# Patient Record
Sex: Female | Born: 1955 | Race: White | Hispanic: No | Marital: Married | Smoking: Never smoker
Health system: Southern US, Community
[De-identification: ages and names within clinical notes are randomized; demographics above are authoritative.]

## PROBLEM LIST (undated history)

## (undated) DIAGNOSIS — Z8719 Personal history of other diseases of the digestive system: Secondary | ICD-10-CM

## (undated) DIAGNOSIS — C801 Malignant (primary) neoplasm, unspecified: Secondary | ICD-10-CM

## (undated) DIAGNOSIS — J45909 Unspecified asthma, uncomplicated: Secondary | ICD-10-CM

## (undated) DIAGNOSIS — K219 Gastro-esophageal reflux disease without esophagitis: Secondary | ICD-10-CM

## (undated) HISTORY — PX: SKIN BIOPSY: SHX1

## (undated) HISTORY — PX: ABDOMINAL HYSTERECTOMY: SHX81

---

## 1998-02-02 ENCOUNTER — Other Ambulatory Visit: Admission: RE | Admit: 1998-02-02 | Discharge: 1998-02-02 | Payer: Self-pay | Admitting: Obstetrics and Gynecology

## 1998-02-03 ENCOUNTER — Observation Stay (HOSPITAL_COMMUNITY): Admission: EM | Admit: 1998-02-03 | Discharge: 1998-02-04 | Payer: Self-pay | Admitting: Internal Medicine

## 1998-03-23 ENCOUNTER — Observation Stay (HOSPITAL_COMMUNITY): Admission: RE | Admit: 1998-03-23 | Discharge: 1998-03-24 | Payer: Self-pay | Admitting: Obstetrics and Gynecology

## 1999-03-31 ENCOUNTER — Other Ambulatory Visit: Admission: RE | Admit: 1999-03-31 | Discharge: 1999-03-31 | Payer: Self-pay | Admitting: Obstetrics and Gynecology

## 2000-04-15 ENCOUNTER — Encounter: Payer: Self-pay | Admitting: Family Medicine

## 2000-04-15 ENCOUNTER — Ambulatory Visit (HOSPITAL_COMMUNITY): Admission: RE | Admit: 2000-04-15 | Discharge: 2000-04-15 | Payer: Self-pay | Admitting: Family Medicine

## 2002-01-29 HISTORY — PX: SINUSOTOMY: SHX291

## 2002-03-27 ENCOUNTER — Other Ambulatory Visit: Admission: RE | Admit: 2002-03-27 | Discharge: 2002-03-27 | Payer: Self-pay | Admitting: Obstetrics and Gynecology

## 2003-10-06 ENCOUNTER — Other Ambulatory Visit: Admission: RE | Admit: 2003-10-06 | Discharge: 2003-10-06 | Payer: Self-pay | Admitting: Obstetrics and Gynecology

## 2003-11-15 ENCOUNTER — Ambulatory Visit (HOSPITAL_COMMUNITY): Admission: RE | Admit: 2003-11-15 | Discharge: 2003-11-15 | Payer: Self-pay | Admitting: Family Medicine

## 2003-11-16 ENCOUNTER — Ambulatory Visit (HOSPITAL_COMMUNITY): Admission: RE | Admit: 2003-11-16 | Discharge: 2003-11-16 | Payer: Self-pay | Admitting: Family Medicine

## 2004-12-11 ENCOUNTER — Other Ambulatory Visit: Admission: RE | Admit: 2004-12-11 | Discharge: 2004-12-11 | Payer: Self-pay | Admitting: Obstetrics and Gynecology

## 2005-09-25 ENCOUNTER — Ambulatory Visit: Payer: Self-pay | Admitting: Internal Medicine

## 2005-10-25 ENCOUNTER — Ambulatory Visit: Payer: Self-pay | Admitting: Internal Medicine

## 2006-02-25 ENCOUNTER — Ambulatory Visit: Payer: Self-pay | Admitting: Unknown Physician Specialty

## 2008-04-02 ENCOUNTER — Encounter: Admission: RE | Admit: 2008-04-02 | Discharge: 2008-04-02 | Payer: Self-pay | Admitting: Allergy

## 2010-06-16 NOTE — Assessment & Plan Note (Signed)
[facility]                               PULMONARY OFFICE NOTE   NAME:[name], [name] REBELLO                     MRN:          [id number]  DATE:09/25/2005                            DOB:          02-Jul-1955    PROBLEM:  Pulmonary consultation at the kind request of Dr. Harriett Rush for  this 55 year old never smoker who complains of cough.   HISTORY:  She denies any prior respiratory complaints other than some mild  seasonal rhinitis.  She had gone to [place] about a year ago and came  back with a cold.  The cough from that has lingered.  She cleared  dramatically during each of two rounds of prednisone, but each time the  cough gradually came back again.  She is now using Singulair, Clarinex, and  Advair which she says seem to be of some help, but she still gets a  breakthrough cough.  She is aware of postnasal drainage laying supine but  denies any sense of reflux or difficulty swallowing.  The cough is worse  outdoors.  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  There  has been some mild pressure type earache bilaterally.  A trial of  amoxicillin helped temporarily.  Mild wheeze and a sense of chest congestion  with minimal shortness of breath are noticed more some days than others but  seem to be persistent.  Overall, she feels worse if active outdoors.   REVIEW OF SYSTEMS:  Shortness of breath with exertion, productive and  nonproductive cough.  Symptoms otherwise as described per HPI.  She denies  fever, chills, rash, adenopathy, purulent or bloody discharge, palpitations,  edema, or weight loss.   MEDICATIONS:  Clarinex, Singulair, Advair 250/50.   ALLERGIES:  DRUG INTOLERANCE TO ASPIRIN WITH HIVES.   PAST HISTORY:  Asthma with this illness, otherwise no history of ear, nose,  or throat problems or surgery.  No previous childhood problems with her  ears.  Mild seasonal nasal congestion.  Recognizing her history of urticaria  from aspirin, she has noticed either wheezing with aspirin or any history of  nasal polyps.  There is no history of heart, liver, or kidney disease,  diabetes, or cancer.  Surgery limited to hysterectomy.   SOCIAL HISTORY:  Never smoked.  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  She is married with children of her own, now in  college.   FAMILY HISTORY:  Nobody with respiratory problems except one daughter with  asthma.  Her father had cancer of the colon.   OBJECTIVE:  VITAL SIGNS:  Weight 153 pounds, BP 114/72, pulse rate was 69,  room air saturation 98%.  GENERAL:  A well-developed, well-nourished, apparently comfortable woman.  SKIN:  No rash, adenopathy; none found.  HEENT:  Eyes  and nose are clear.  There is minimal erythema of the throat  without glandular prominence or visible drainage.  Tympanic membranes are  clear.  Voice quality is normal.  CHEST:  Trace cough with deep breath.  No wheeze, rhonchi, or dullness.  Work of breathing is not increased.  HEART:  Heart sounds are regular, normal S1/S2, no murmur or gallop.  ABDOMEN:  No enlargement of liver or spleen.  EXTREMITIES:  No cyanosis, clubbing, or edema.   LABORATORY DATA:  Spirometry from Dr. 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  FTC was 2.84 (79%), FEV-1 2.20  (75%), ratio 77.4 (95% predicted), FEF 25-75% was 61% of predicted.  CBC  from February 19, 2005, was unremarkable with eosinophil percentage 4.5.  Chest x-ray report from August 22, 2005, at [facility], described  normal chest with heavy lung markings.   IMPRESSION:  Cough, by history most  consistent with a postinflammatory  bronchitis.  Common in this situation are low-grade chronic sinus  infections, chronic bacterial inflammation, and cyclical cough with  interacting components of mild reflux, sinus drainage, and bronchospasm.  With luck, she might respond to another aggressive antibiotic burst before  we look for sustaining triggers.   PLAN:  1. Avalox 400 mg daily x 10 days.  2. Pepcid AC, one daily for at least 2-3 weeks.  3. Saline nasal lavage.  4. Schedule to return in three weeks or earlier p.r.n.   I appreciate the chance to meet this nice lady and hope I can be helpful.                                   Clinton D. Maple Hudson, MD, [facility], FACP   CDY/MedQ  DD:  09/26/2005  DT:  09/27/2005  Job #:  [id number]   cc:   Royetta Crochet, MD

## 2010-06-16 NOTE — Assessment & Plan Note (Signed)
 HEALTHCARE                               PULMONARY OFFICE NOTE   NAME:[name] [name], [name] [name]                     MRN:          [id number]  DATE:10/25/2005                            DOB:          [birth year]    PROBLEM:  Cough/bronchitis.   HISTORY:  She says cough completely cleared while she was on the Avalox that  we gave her in August, but within a few days after finishing the antibiotics  the cough gradually returned.  She did not try saline lavage, but she has  been taking the Pepcid.  Avalox gave a little GI upset, which has resolved.   MEDICATIONS:  Clarinex, Singulair, Advair 250/50, Pepcid.   DRUG INTOLERANCES:  ASPIRIN with urticaria.   OBJECTIVE:  Weight 157 pounds, BP 122/80, pulse regular 88, room air  saturation 97%.  She does not look uncomfortable.  Pharynx is a little red.  There is no visible drainage or nasal congestion.  LUNGS:  Sound quite clear and she is not coughing.  Breathing is unlabored.  HEART:  Sounds are regular without murmur.   IMPRESSION:  Cough with the usual suspect causes.  I have favored some  bronchitis but cannot exclude either nasal drainage or a reflux component.  It appears to be responsive to antibiotics, which is the best clue  currently.  Note that chest x-ray showed prominent lung markings as done at  [facility] July 25.   PLAN:  1. Continue daily Pepcid.  2. Omnicef 300 mg b.i.d. for 3 weeks with discussion.  3. Schedule return 1 month, earlier p.r.n.       Clinton D. Maple Hudson, MD, [facility], FACP      CDY/MedQ  DD:  11/04/2005  DT:  11/05/2005  Job #:  [id number]   cc:   Royetta Crochet, MD

## 2011-09-12 ENCOUNTER — Ambulatory Visit
Admission: RE | Admit: 2011-09-12 | Discharge: 2011-09-12 | Disposition: A | Discharge status: Home / Self Care | Payer: BC Managed Care – PPO | Source: Ambulatory Visit | Attending: Family Medicine | Admitting: Family Medicine

## 2011-09-12 ENCOUNTER — Other Ambulatory Visit: Payer: Self-pay | Admitting: Family Medicine

## 2011-09-12 DIAGNOSIS — R1031 Right lower quadrant pain: Secondary | ICD-10-CM

## 2011-09-12 MED ORDER — IOHEXOL 300 MG/ML  SOLN
100.0000 mL | Freq: Once | INTRAMUSCULAR | Status: AC | PRN
  Administered 2011-09-12: 100 mL via INTRAVENOUS

## 2011-09-12 MED ORDER — IOHEXOL 300 MG/ML  SOLN
30.0000 mL | Freq: Once | INTRAMUSCULAR | Status: AC | PRN
  Administered 2011-09-12: 30 mL via ORAL

## 2012-01-10 ENCOUNTER — Ambulatory Visit: Payer: BC Managed Care – PPO | Attending: Family Medicine | Admitting: Physical Therapy

## 2012-01-10 DIAGNOSIS — IMO0001 Reserved for inherently not codable concepts without codable children: Secondary | ICD-10-CM | POA: Insufficient documentation

## 2012-01-10 DIAGNOSIS — M25579 Pain in unspecified ankle and joints of unspecified foot: Secondary | ICD-10-CM | POA: Insufficient documentation

## 2012-01-10 DIAGNOSIS — IMO0002 Reserved for concepts with insufficient information to code with codable children: Secondary | ICD-10-CM | POA: Insufficient documentation

## 2012-01-15 ENCOUNTER — Ambulatory Visit: Payer: BC Managed Care – PPO | Admitting: Physical Therapy

## 2012-01-17 ENCOUNTER — Ambulatory Visit: Payer: BC Managed Care – PPO | Admitting: Physical Therapy

## 2015-03-14 ENCOUNTER — Other Ambulatory Visit: Payer: Self-pay | Admitting: Obstetrics and Gynecology

## 2015-03-14 DIAGNOSIS — R928 Other abnormal and inconclusive findings on diagnostic imaging of breast: Secondary | ICD-10-CM

## 2015-03-18 ENCOUNTER — Ambulatory Visit
Admission: RE | Admit: 2015-03-18 | Discharge: 2015-03-18 | Disposition: A | Discharge status: Home / Self Care | Payer: BC Managed Care – PPO | Source: Ambulatory Visit | Attending: Obstetrics and Gynecology | Admitting: Obstetrics and Gynecology

## 2015-03-18 DIAGNOSIS — R928 Other abnormal and inconclusive findings on diagnostic imaging of breast: Secondary | ICD-10-CM

## 2016-03-15 ENCOUNTER — Other Ambulatory Visit: Payer: Self-pay | Admitting: Obstetrics and Gynecology

## 2016-03-15 DIAGNOSIS — R928 Other abnormal and inconclusive findings on diagnostic imaging of breast: Secondary | ICD-10-CM

## 2016-03-28 ENCOUNTER — Ambulatory Visit
Admission: RE | Admit: 2016-03-28 | Discharge: 2016-03-28 | Disposition: A | Discharge status: Home / Self Care | Payer: BC Managed Care – PPO | Source: Ambulatory Visit | Attending: Obstetrics and Gynecology | Admitting: Obstetrics and Gynecology

## 2016-03-28 ENCOUNTER — Other Ambulatory Visit: Payer: Self-pay | Admitting: Obstetrics and Gynecology

## 2016-03-28 DIAGNOSIS — R599 Enlarged lymph nodes, unspecified: Secondary | ICD-10-CM

## 2016-03-28 DIAGNOSIS — R928 Other abnormal and inconclusive findings on diagnostic imaging of breast: Secondary | ICD-10-CM

## 2016-03-29 ENCOUNTER — Other Ambulatory Visit: Payer: Self-pay | Admitting: Obstetrics and Gynecology

## 2016-03-29 ENCOUNTER — Ambulatory Visit
Admission: RE | Admit: 2016-03-29 | Discharge: 2016-03-29 | Disposition: A | Discharge status: Home / Self Care | Payer: BC Managed Care – PPO | Source: Ambulatory Visit | Attending: Obstetrics and Gynecology | Admitting: Obstetrics and Gynecology

## 2016-03-29 DIAGNOSIS — R599 Enlarged lymph nodes, unspecified: Secondary | ICD-10-CM

## 2016-06-19 ENCOUNTER — Other Ambulatory Visit: Payer: Self-pay | Admitting: Family Medicine

## 2016-06-19 ENCOUNTER — Ambulatory Visit
Admission: RE | Admit: 2016-06-19 | Discharge: 2016-06-19 | Disposition: A | Discharge status: Home / Self Care | Payer: BC Managed Care – PPO | Source: Ambulatory Visit | Attending: Family Medicine | Admitting: Family Medicine

## 2016-06-19 DIAGNOSIS — R5081 Fever presenting with conditions classified elsewhere: Secondary | ICD-10-CM

## 2016-06-22 ENCOUNTER — Ambulatory Visit
Admission: RE | Admit: 2016-06-22 | Discharge: 2016-06-22 | Disposition: A | Discharge status: Home / Self Care | Payer: BC Managed Care – PPO | Source: Ambulatory Visit | Attending: Family Medicine | Admitting: Family Medicine

## 2016-06-22 ENCOUNTER — Other Ambulatory Visit: Payer: Self-pay | Admitting: Family Medicine

## 2016-06-22 DIAGNOSIS — R7989 Other specified abnormal findings of blood chemistry: Secondary | ICD-10-CM

## 2016-06-22 DIAGNOSIS — R945 Abnormal results of liver function studies: Secondary | ICD-10-CM

## 2016-06-22 DIAGNOSIS — R509 Fever, unspecified: Secondary | ICD-10-CM

## 2016-06-22 MED ORDER — IOPAMIDOL (ISOVUE-300) INJECTION 61%
100.0000 mL | Freq: Once | INTRAVENOUS | Status: AC | PRN
  Administered 2016-06-22: 100 mL via INTRAVENOUS

## 2016-07-18 ENCOUNTER — Other Ambulatory Visit: Payer: Self-pay | Admitting: Urology

## 2016-08-14 NOTE — Progress Notes (Signed)
06-19-16 (EPIC) CXR

## 2016-08-14 NOTE — Patient Instructions (Addendum)
[name] [name]  08/14/2016   Your procedure is scheduled on: 08-20-16  Report to [facility] Main  Entrance Take [place]  elevators to 3rd floor to [place] at 5:15 AM.   Call this number if you have problems the morning of surgery [phone]   Remember: ONLY 1 PERSON MAY GO WITH YOU TO SHORT STAY TO GET  READY MORNING OF [place].  Do not eat food or drink liquids :After Midnight.     Take these medicines the morning of surgery with A SIP OF WATER: Rantidine (Zantac)                                You may not have any metal on your body including hair pins and              piercings  Do not wear jewelry, make-up, lotions, powders or perfumes, deodorant             Do not wear nail polish.  Do not shave  48 hours prior to surgery.               Do not bring valuables to the hospital. [place].  Contacts, dentures or bridgework may not be worn into surgery.  Leave suitcase in the car. After surgery it may be brought to your room.                 Please read over the following fact sheets you were given: _____________________________________________________________________             [facility] - Preparing for Surgery Before surgery, you can play an important role.  Because skin is not sterile, your skin needs to be as free of germs as possible.  You can reduce the number of germs on your skin by washing with CHG (chlorahexidine gluconate) soap before surgery.  CHG is an antiseptic cleaner which kills germs and bonds with the skin to continue killing germs even after washing. Please DO NOT use if you have an allergy to CHG or antibacterial soaps.  If your skin becomes reddened/irritated stop using the CHG and inform your nurse when you arrive at Short Stay. Do not shave (including legs and underarms) for at least 48 hours prior to the first CHG shower.  You may shave your face/neck. Please  follow these instructions carefully:  1.  Shower with CHG Soap the night before surgery and the  morning of Surgery.  2.  If you choose to wash your hair, wash your hair first as usual with your  normal  shampoo.  3.  After you shampoo, rinse your hair and body thoroughly to remove the  shampoo.                           4.  Use CHG as you would any other liquid soap.  You can apply chg directly  to the skin and wash                       Gently with a scrungie or clean washcloth.  5.  Apply the CHG Soap to your body ONLY FROM THE NECK  DOWN.   Do not use on face/ open                           Wound or open sores. Avoid contact with eyes, ears mouth and genitals (private parts).                       Wash face,  Genitals (private parts) with your normal soap.             6.  Wash thoroughly, paying special attention to the area where your surgery  will be performed.  7.  Thoroughly rinse your body with warm water from the neck down.  8.  DO NOT shower/wash with your normal soap after using and rinsing off  the CHG Soap.                9.  Pat yourself dry with a clean towel.            10.  Wear clean pajamas.            11.  Place clean sheets on your bed the night of your first shower and do not  sleep with pets. Day of Surgery : Do not apply any lotions/deodorants the morning of surgery.  Please wear clean clothes to the hospital/surgery center.  FAILURE TO FOLLOW THESE INSTRUCTIONS MAY RESULT IN THE CANCELLATION OF YOUR SURGERY PATIENT SIGNATURE_________________________________  NURSE SIGNATURE__________________________________  ________________________________________________________________________   Adam Phenix  An incentive spirometer is a tool that can help keep your lungs clear and active. This tool measures how well you are filling your lungs with each breath. Taking long deep breaths may help reverse or decrease the chance of developing breathing (pulmonary) problems  (especially infection) following:  A long period of time when you are unable to move or be active. BEFORE THE PROCEDURE   If the spirometer includes an indicator to show your best effort, your nurse or respiratory therapist will set it to a desired goal.  If possible, sit up straight or lean slightly forward. Try not to slouch.  Hold the incentive spirometer in an upright position. INSTRUCTIONS FOR USE  1. Sit on the edge of your bed if possible, or sit up as far as you can in bed or on a chair. 2. Hold the incentive spirometer in an upright position. 3. Breathe out normally. 4. Place the mouthpiece in your mouth and seal your lips tightly around it. 5. Breathe in slowly and as deeply as possible, raising the piston or the ball toward the top of the column. 6. Hold your breath for 3-5 seconds or for as long as possible. Allow the piston or ball to fall to the bottom of the column. 7. Remove the mouthpiece from your mouth and breathe out normally. 8. Rest for a few seconds and repeat Steps 1 through 7 at least 10 times every 1-2 hours when you are awake. Take your time and take a few normal breaths between deep breaths. 9. The spirometer may include an indicator to show your best effort. Use the indicator as a goal to work toward during each repetition. 10. After each set of 10 deep breaths, practice coughing to be sure your lungs are clear. If you have an incision (the cut made at the time of surgery), support your incision when coughing by placing a pillow or rolled up towels firmly against it. Once you are able to  get out of bed, walk around indoors and cough well. You may stop using the incentive spirometer when instructed by your caregiver.  RISKS AND COMPLICATIONS  Take your time so you do not get dizzy or light-headed.  If you are in pain, you may need to take or ask for pain medication before doing incentive spirometry. It is harder to take a deep breath if you are having  pain. AFTER USE  Rest and breathe slowly and easily.  It can be helpful to keep track of a log of your progress. Your caregiver can provide you with a simple table to help with this. If you are using the spirometer at home, follow these instructions: [place] IF:   You are having difficultly using the spirometer.  You have trouble using the spirometer as often as instructed.  Your pain medication is not giving enough relief while using the spirometer.  You develop fever of 100.5 F (38.1 C) or higher. SEEK IMMEDIATE MEDICAL CARE IF:   You cough up bloody sputum that had not been present before.  You develop fever of 102 F (38.9 C) or greater.  You develop worsening pain at or near the incision site. MAKE SURE YOU:   Understand these instructions.  Will watch your condition.  Will get help right away if you are not doing well or get worse. Document Released: 05/28/2006 Document Revised: 04/09/2011 Document Reviewed: 07/29/2006 ExitCare Patient Information 2014 ExitCare, [place].   ________________________________________________________________________  WHAT IS A BLOOD TRANSFUSION? Blood Transfusion Information  A transfusion is the replacement of blood or some of its parts. Blood is made up of multiple cells which provide different functions.  Red blood cells carry oxygen and are used for blood loss replacement.  White blood cells fight against infection.  Platelets control bleeding.  Plasma helps clot blood.  Other blood products are available for specialized needs, such as hemophilia or other clotting disorders. BEFORE THE TRANSFUSION  Who gives blood for transfusions?   Healthy volunteers who are fully evaluated to make sure their blood is safe. This is blood bank blood. Transfusion therapy is the safest it has ever been in the practice of medicine. Before blood is taken from a donor, a complete history is taken to make sure that person has no history  of diseases nor engages in risky social behavior (examples are intravenous drug use or sexual activity with multiple partners). The donor's travel history is screened to minimize risk of transmitting infections, such as malaria. The donated blood is tested for signs of infectious diseases, such as HIV and hepatitis. The blood is then tested to be sure it is compatible with you in order to minimize the chance of a transfusion reaction. If you or a relative donates blood, this is often done in anticipation of surgery and is not appropriate for emergency situations. It takes many days to process the donated blood. RISKS AND COMPLICATIONS Although transfusion therapy is very safe and saves many lives, the main dangers of transfusion include:   Getting an infectious disease.  Developing a transfusion reaction. This is an allergic reaction to something in the blood you were given. Every precaution is taken to prevent this. The decision to have a blood transfusion has been considered carefully by your caregiver before blood is given. Blood is not given unless the benefits outweigh the risks. AFTER THE TRANSFUSION  Right after receiving a blood transfusion, you will usually feel much better and more energetic. This is especially true if  your red blood cells have gotten low (anemic). The transfusion raises the level of the red blood cells which carry oxygen, and this usually causes an energy increase.  The nurse administering the transfusion will monitor you carefully for complications. HOME CARE INSTRUCTIONS  No special instructions are needed after a transfusion. You may find your energy is better. Speak with your caregiver about any limitations on activity for underlying diseases you may have. SEEK MEDICAL CARE IF:   Your condition is not improving after your transfusion.  You develop redness or irritation at the intravenous (IV) site. SEEK IMMEDIATE MEDICAL CARE IF:  Any of the following symptoms  occur over the next 12 hours:  Shaking chills.  You have a temperature by mouth above 102 F (38.9 C), not controlled by medicine.  Chest, back, or muscle pain.  People around you feel you are not acting correctly or are confused.  Shortness of breath or difficulty breathing.  Dizziness and fainting.  You get a rash or develop hives.  You have a decrease in urine output.  Your urine turns a dark color or changes to pink, red, or brown. Any of the following symptoms occur over the next 10 days:  You have a temperature by mouth above 102 F (38.9 C), not controlled by medicine.  Shortness of breath.  Weakness after normal activity.  The white part of the eye turns yellow (jaundice).  You have a decrease in the amount of urine or are urinating less often.  Your urine turns a dark color or changes to pink, red, or brown. Document Released: 01/13/2000 Document Revised: 04/09/2011 Document Reviewed: 09/01/2007 [facility] Patient Information 2014 [place], [place].  _______________________________________________________________________

## 2016-08-16 ENCOUNTER — Encounter (HOSPITAL_COMMUNITY): Payer: Self-pay

## 2016-08-16 ENCOUNTER — Encounter (HOSPITAL_COMMUNITY)
Admission: RE | Admit: 2016-08-16 | Discharge: 2016-08-16 | Disposition: A | Discharge status: Home / Self Care | Payer: BC Managed Care – PPO | Source: Ambulatory Visit | Attending: Urology | Admitting: Urology

## 2016-08-16 DIAGNOSIS — D4102 Neoplasm of uncertain behavior of left kidney: Secondary | ICD-10-CM | POA: Insufficient documentation

## 2016-08-16 DIAGNOSIS — Z01818 Encounter for other preprocedural examination: Secondary | ICD-10-CM | POA: Diagnosis present

## 2016-08-16 HISTORY — DX: Personal history of other diseases of the digestive system: Z87.19

## 2016-08-16 HISTORY — DX: Unspecified asthma, uncomplicated: J45.909

## 2016-08-16 HISTORY — DX: Malignant (primary) neoplasm, unspecified: C80.1

## 2016-08-16 HISTORY — DX: Gastro-esophageal reflux disease without esophagitis: K21.9

## 2016-08-16 LAB — CBC
HCT: 41.9 % (ref 36.0–46.0)
Hemoglobin: 14 g/dL (ref 12.0–15.0)
MCH: 29.4 pg (ref 26.0–34.0)
MCHC: 33.4 g/dL (ref 30.0–36.0)
MCV: 88 fL (ref 78.0–100.0)
Platelets: 250 10*3/uL (ref 150–400)
RBC: 4.76 MIL/uL (ref 3.87–5.11)
RDW: 12.9 % (ref 11.5–15.5)
WBC: 6 10*3/uL (ref 4.0–10.5)

## 2016-08-16 LAB — BASIC METABOLIC PANEL
Anion gap: 7 (ref 5–15)
BUN: 10 mg/dL (ref 6–20)
CHLORIDE: 105 mmol/L (ref 101–111)
CO2: 27 mmol/L (ref 22–32)
CREATININE: 0.85 mg/dL (ref 0.44–1.00)
Calcium: 9.3 mg/dL (ref 8.9–10.3)
GFR calc Af Amer: >60 mL/min (ref >60)
GFR calc non Af Amer: >60 mL/min (ref >60)
GLUCOSE: 108 mg/dL — AB (ref 65–99)
Potassium: 4.6 mmol/L (ref 3.5–5.1)
SODIUM: 139 mmol/L (ref 135–145)

## 2016-08-16 LAB — ABO/RH: ABO/RH(D): A POS

## 2016-08-19 NOTE — H&P (Signed)
CC/HPI: CC: Left renal neoplasm    [name] [name] is a 61 year old who underwent a CT scan of the abdomen and pelvis with IV contrast on 06/22/16 for symptoms of fever and elevated LFTs. She was eventually diagnosed with monocleosis but her CT scan incidentally detected a 1.1 cm lateral interpolar hyperdense left renal neoplasm concerning for possible malignancy.     Family history of kidney cancer: None.   Family history of ESRD: No.     Imaging: (06/23/26) - CT of the abdomen and pelvis with contrast   Side of renal neoplasm: Left   Size of renal neoplasm: 1.1 cm   Location of renal neoplasm: Lateral interpolar left kidney   Exophytic or endophytic: Partially exophytic   Renal nephrometry score: 7x     Renal artery anatomy: Single renal artery   Renal vein anatomy: Single renal vein     Contralateral renal lesions:   Regional lymphadenopathy: None.   Adrenal masses: None.   Renal vein/IVC involvement: No.   Metastatic disease to the abdomen: No.     Chest imaging: CXR - normal   LFTs: Normal     Baseline renal function: 0.93 Cr , eGFR >60 ml/min     PMH: Past medical history is significant for hyperlipidemia, asthma, and GERD. She also has a history of melanoma removed from her left upper extremity surgically. She has not had recurrence.   PSH: She has previously undergone a laparoscopic assisted vaginal hysterectomy for benign causes.        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     Tobacco Use Assessment Completed: Used Tobacco in last 30 days?  Has never drank.   Patient's occupation Community education officer.       REVIEW OF SYSTEMS:     GU Review Female:   Patient denies frequent urination, hard to postpone urination, burning /pain with urination, get up at night to urinate, leakage of urine, stream starts and stops, trouble starting your stream, have to strain to urinate, and currently pregnant.   Gastrointestinal (Upper):   Patient denies nausea and vomiting.   Gastrointestinal (Lower):   Patient denies diarrhea and constipation.   Constitutional:   Patient denies fever, night sweats, weight loss, and fatigue.   Skin:   Patient denies skin rash/ lesion and itching.   Eyes:   Patient denies blurred vision and double vision.   Ears/ Nose/ Throat:   Patient denies sore throat and sinus problems.   Hematologic/Lymphatic:   Patient denies swollen glands and easy bruising.   Cardiovascular:   Patient denies leg swelling and chest pains.   Respiratory:  Patient denies cough and shortness of breath.   Endocrine:   Patient denies excessive thirst.   Musculoskeletal:   Patient denies back pain and joint pain.   Neurological:   Patient denies headaches and dizziness.   Psychologic:   Patient denies depression and anxiety.        MULTI-SYSTEM PHYSICAL EXAMINATION:     Constitutional: Well-nourished. No physical deformities. Normally developed. Good grooming.   Neck: Neck symmetrical, not swollen. Normal tracheal position.   Respiratory: No labored breathing, no use of accessory muscles. Clear bilaterally.   Cardiovascular: Normal temperature, normal extremity pulses, no swelling, no varicosities. Regular rate and rhythm.   Lymphatic: No enlargement of neck, axillae, groin.   Skin: No paleness, no jaundice, no cyanosis. No lesion, no ulcer, no  rash.   Neurologic / Psychiatric: Oriented to time, oriented to place, oriented to person. No depression, no anxiety, no agitation.   Gastrointestinal: No mass, no tenderness, no rigidity, non obese abdomen. She does have a well-healed subumbilical laparoscopic incision scar.   Eyes: Normal conjunctivae. Normal eyelids.   Ears, Nose, Mouth, and Throat: Left ear no scars, no lesions, no masses. Right ear no scars, no lesions, no masses. Nose no scars, no lesions, no masses. Normal hearing. Normal lips.   Musculoskeletal: Normal gait and station of head and neck.              ASSESSMENT:    1 GU:   Left renal neoplasm - D49.512      PLAN:         1. Left renal neoplasm suspicious for malignancy:  I have recommended that she undergo a left robot-assisted laparoscopic partial nephrectomy for both diagnostic and therapeutic purposes. We have reviewed the alternative options and the expected recovery process with this procedure. She gives informed consent and would like to proceed. I discussed the potential benefits and risks of the procedure, side effects of the proposed treatment, the likelihood of the patient achieving the goals of the procedure, and any potential problems that might occur during the procedure or recuperation.

## 2016-08-20 ENCOUNTER — Encounter (HOSPITAL_COMMUNITY): Payer: Self-pay | Admitting: *Deleted

## 2016-08-20 ENCOUNTER — Inpatient Hospital Stay (HOSPITAL_COMMUNITY)
Admission: RE | Admit: 2016-08-20 | Discharge: 2016-08-21 | DRG: 658 | Disposition: A | Discharge status: Home / Self Care | Payer: BC Managed Care – PPO | Source: Ambulatory Visit | Attending: Urology | Admitting: Urology

## 2016-08-20 ENCOUNTER — Encounter (HOSPITAL_COMMUNITY)
Admission: RE | Disposition: A | Discharge status: Home / Self Care | Payer: Self-pay | Source: Ambulatory Visit | Attending: Urology

## 2016-08-20 ENCOUNTER — Inpatient Hospital Stay (HOSPITAL_COMMUNITY): Payer: BC Managed Care – PPO | Admitting: Anesthesiology

## 2016-08-20 DIAGNOSIS — Z885 Allergy status to narcotic agent status: Secondary | ICD-10-CM | POA: Diagnosis not present

## 2016-08-20 DIAGNOSIS — D49512 Neoplasm of unspecified behavior of left kidney: Principal | ICD-10-CM | POA: Diagnosis present

## 2016-08-20 DIAGNOSIS — Z85820 Personal history of malignant melanoma of skin: Secondary | ICD-10-CM

## 2016-08-20 DIAGNOSIS — J45909 Unspecified asthma, uncomplicated: Secondary | ICD-10-CM | POA: Diagnosis present

## 2016-08-20 DIAGNOSIS — Z79899 Other long term (current) drug therapy: Secondary | ICD-10-CM

## 2016-08-20 DIAGNOSIS — Z886 Allergy status to analgesic agent status: Secondary | ICD-10-CM

## 2016-08-20 DIAGNOSIS — K219 Gastro-esophageal reflux disease without esophagitis: Secondary | ICD-10-CM | POA: Diagnosis present

## 2016-08-20 DIAGNOSIS — E785 Hyperlipidemia, unspecified: Secondary | ICD-10-CM | POA: Diagnosis present

## 2016-08-20 HISTORY — PX: ROBOT ASSISTED LAPAROSCOPIC NEPHRECTOMY: SHX5140

## 2016-08-20 LAB — BASIC METABOLIC PANEL
Anion gap: 4 — ABNORMAL LOW (ref 5–15)
BUN: 10 mg/dL (ref 6–20)
CO2: 29 mmol/L (ref 22–32)
CREATININE: 0.95 mg/dL (ref 0.44–1.00)
Calcium: 8.6 mg/dL — ABNORMAL LOW (ref 8.9–10.3)
Chloride: 106 mmol/L (ref 101–111)
Glucose, Bld: 152 mg/dL — ABNORMAL HIGH (ref 65–99)
Potassium: 3.7 mmol/L (ref 3.5–5.1)
SODIUM: 139 mmol/L (ref 135–145)

## 2016-08-20 LAB — TYPE AND SCREEN
ABO/RH(D): A POS
Antibody Screen: NEGATIVE

## 2016-08-20 LAB — HEMOGLOBIN AND HEMATOCRIT, BLOOD
HEMATOCRIT: 38 % (ref 36.0–46.0)
HEMOGLOBIN: 12.7 g/dL (ref 12.0–15.0)

## 2016-08-20 SURGERY — ROBOTIC ASSISTED LAPAROSCOPIC NEPHRECTOMY
Anesthesia: General | Site: Abdomen | Laterality: Left

## 2016-08-20 MED ORDER — HYDROMORPHONE HCL-NACL 0.5-0.9 MG/ML-% IV SOSY
PREFILLED_SYRINGE | INTRAVENOUS | Status: AC
  Administered 2016-08-20: 1 mg via INTRAVENOUS
  Filled 2016-08-20: qty 2

## 2016-08-20 MED ORDER — FENTANYL CITRATE (PF) 250 MCG/5ML IJ SOLN
INTRAMUSCULAR | Status: AC
  Filled 2016-08-20: qty 5

## 2016-08-20 MED ORDER — ROCURONIUM BROMIDE 10 MG/ML (PF) SYRINGE
PREFILLED_SYRINGE | INTRAVENOUS | Status: DC | PRN
  Administered 2016-08-20: 20 mg via INTRAVENOUS
  Administered 2016-08-20: 50 mg via INTRAVENOUS
  Administered 2016-08-20: 10 mg via INTRAVENOUS

## 2016-08-20 MED ORDER — FENTANYL CITRATE (PF) 100 MCG/2ML IJ SOLN
INTRAMUSCULAR | Status: DC | PRN
  Administered 2016-08-20 (×3): 50 ug via INTRAVENOUS
  Administered 2016-08-20: 100 ug via INTRAVENOUS
  Administered 2016-08-20 (×3): 50 ug via INTRAVENOUS

## 2016-08-20 MED ORDER — DIPHENHYDRAMINE HCL 12.5 MG/5ML PO ELIX
12.5000 mg | ORAL_SOLUTION | Freq: Four times a day (QID) | ORAL | Status: DC | PRN
Start: 2016-08-20 — End: 2016-08-21

## 2016-08-20 MED ORDER — CEFAZOLIN SODIUM-DEXTROSE 2-4 GM/100ML-% IV SOLN
INTRAVENOUS | Status: AC
  Filled 2016-08-20: qty 100

## 2016-08-20 MED ORDER — MIDAZOLAM HCL 5 MG/5ML IJ SOLN
INTRAMUSCULAR | Status: DC | PRN
  Administered 2016-08-20: 2 mg via INTRAVENOUS

## 2016-08-20 MED ORDER — MIDAZOLAM HCL 2 MG/2ML IJ SOLN
INTRAMUSCULAR | Status: AC
  Filled 2016-08-20: qty 2

## 2016-08-20 MED ORDER — FAMOTIDINE 20 MG PO TABS
10.0000 mg | ORAL_TABLET | Freq: Every day | ORAL | Status: DC
  Administered 2016-08-21: 10 mg via ORAL
  Filled 2016-08-20: qty 1

## 2016-08-20 MED ORDER — SUGAMMADEX SODIUM 200 MG/2ML IV SOLN
INTRAVENOUS | Status: DC | PRN
  Administered 2016-08-20: 150 mg via INTRAVENOUS

## 2016-08-20 MED ORDER — DEXAMETHASONE SODIUM PHOSPHATE 10 MG/ML IJ SOLN
INTRAMUSCULAR | Status: DC | PRN
  Administered 2016-08-20: 10 mg via INTRAVENOUS

## 2016-08-20 MED ORDER — TRAMADOL HCL 50 MG PO TABS: 50.0000 mg | ORAL_TABLET | Freq: Four times a day (QID) | ORAL | 0 refills | Status: DC | PRN

## 2016-08-20 MED ORDER — BUPIVACAINE-EPINEPHRINE (PF) 0.25% -1:200000 IJ SOLN
INTRAMUSCULAR | Status: AC
  Filled 2016-08-20: qty 30

## 2016-08-20 MED ORDER — CEFAZOLIN SODIUM-DEXTROSE 1-4 GM/50ML-% IV SOLN
1.0000 g | Freq: Three times a day (TID) | INTRAVENOUS | Status: AC
  Administered 2016-08-20 (×2): 1 g via INTRAVENOUS
  Filled 2016-08-20 (×2): qty 50

## 2016-08-20 MED ORDER — DOCUSATE SODIUM 100 MG PO CAPS
100.0000 mg | ORAL_CAPSULE | Freq: Two times a day (BID) | ORAL | Status: DC
  Administered 2016-08-20 – 2016-08-21 (×2): 100 mg via ORAL
  Filled 2016-08-20 (×2): qty 1

## 2016-08-20 MED ORDER — MOMETASONE FURO-FORMOTEROL FUM 200-5 MCG/ACT IN AERO
2.0000 | INHALATION_SPRAY | Freq: Two times a day (BID) | RESPIRATORY_TRACT | Status: DC
  Filled 2016-08-20: qty 8.8

## 2016-08-20 MED ORDER — SCOPOLAMINE 1 MG/3DAYS TD PT72
MEDICATED_PATCH | TRANSDERMAL | Status: AC
  Filled 2016-08-20: qty 1

## 2016-08-20 MED ORDER — LEVALBUTEROL TARTRATE 45 MCG/ACT IN AERO: 2.0000 | INHALATION_SPRAY | RESPIRATORY_TRACT | Status: DC | PRN

## 2016-08-20 MED ORDER — BUPIVACAINE LIPOSOME 1.3 % IJ SUSP
20.0000 mL | Freq: Once | INTRAMUSCULAR | Status: AC
  Administered 2016-08-20: 20 mL
  Filled 2016-08-20: qty 20

## 2016-08-20 MED ORDER — HYDROMORPHONE HCL-NACL 0.5-0.9 MG/ML-% IV SOSY
0.2500 mg | PREFILLED_SYRINGE | INTRAVENOUS | Status: DC | PRN
  Administered 2016-08-20 (×2): 0.5 mg via INTRAVENOUS

## 2016-08-20 MED ORDER — DIPHENHYDRAMINE HCL 50 MG/ML IJ SOLN: 12.5000 mg | Freq: Four times a day (QID) | INTRAMUSCULAR | Status: DC | PRN

## 2016-08-20 MED ORDER — SCOPOLAMINE 1 MG/3DAYS TD PT72SCOPOLAMINE 1 MG/3DAYS
MEDICATED_PATCH | TRANSDERMAL | Status: DC | PRN
Start: 2016-08-20 — End: 2016-08-20
  Administered 2016-08-20: 1 via TRANSDERMAL

## 2016-08-20 MED ORDER — STERILE WATER FOR IRRIGATION IR SOLN
Status: DC | PRN
  Administered 2016-08-20: 1000 mL

## 2016-08-20 MED ORDER — ACETAMINOPHEN 10 MG/ML IV SOLN
1000.0000 mg | Freq: Four times a day (QID) | INTRAVENOUS | Status: AC
  Administered 2016-08-20 – 2016-08-21 (×3): 1000 mg via INTRAVENOUS
  Filled 2016-08-20 (×4): qty 100

## 2016-08-20 MED ORDER — DEXTROSE-NACL 5-0.45 % IV SOLN
INTRAVENOUS | Status: DC
  Administered 2016-08-20 – 2016-08-21 (×3): via INTRAVENOUS

## 2016-08-20 MED ORDER — LIDOCAINE 2% (20 MG/ML) 5 ML SYRINGE
INTRAMUSCULAR | Status: DC | PRN
  Administered 2016-08-20: 50 mg via INTRAVENOUS

## 2016-08-20 MED ORDER — ACETAMINOPHEN 10 MG/ML IV SOLN
INTRAVENOUS | Status: AC
  Filled 2016-08-20: qty 100

## 2016-08-20 MED ORDER — SIMVASTATIN 10 MG PO TABS
10.0000 mg | ORAL_TABLET | Freq: Every evening | ORAL | Status: DC
  Administered 2016-08-20: 10 mg via ORAL
  Filled 2016-08-20: qty 1

## 2016-08-20 MED ORDER — SALINE SPRAY 0.65 % NA SOLN: NASAL | Status: DC | PRN

## 2016-08-20 MED ORDER — EPHEDRINE SULFATE-NACL 50-0.9 MG/10ML-% IV SOSY
PREFILLED_SYRINGE | INTRAVENOUS | Status: DC | PRN
  Administered 2016-08-20: 10 mg via INTRAVENOUS

## 2016-08-20 MED ORDER — ALBUTEROL SULFATE (2.5 MG/3ML) 0.083% IN NEBU
2.5000 mg | INHALATION_SOLUTION | Freq: Four times a day (QID) | RESPIRATORY_TRACT | Status: DC | PRN
Start: 2016-08-20 — End: 2016-08-21

## 2016-08-20 MED ORDER — PROMETHAZINE HCL 25 MG/ML IJ SOLN: 6.2500 mg | INTRAMUSCULAR | Status: DC | PRN

## 2016-08-20 MED ORDER — SODIUM CHLORIDE 0.9 % IJ SOLN
INTRAMUSCULAR | Status: AC
  Filled 2016-08-20: qty 20

## 2016-08-20 MED ORDER — TRAMADOL HCL 50 MG PO TABS
50.0000 mg | ORAL_TABLET | Freq: Four times a day (QID) | ORAL | Status: DC | PRN
  Administered 2016-08-21 (×2): 50 mg via ORAL
  Filled 2016-08-20: qty 2
  Filled 2016-08-20: qty 1

## 2016-08-20 MED ORDER — LACTATED RINGERS IV SOLN
INTRAVENOUS | Status: DC | PRN
  Administered 2016-08-20 (×2): via INTRAVENOUS

## 2016-08-20 MED ORDER — ONDANSETRON HCL 4 MG/2ML IJ SOLN
INTRAMUSCULAR | Status: DC | PRN
  Administered 2016-08-20: 4 mg via INTRAVENOUS

## 2016-08-20 MED ORDER — HYDROMORPHONE HCL-NACL 0.5-0.9 MG/ML-% IV SOSY
0.5000 mg | PREFILLED_SYRINGE | INTRAVENOUS | Status: DC | PRN
  Administered 2016-08-20 – 2016-08-21 (×4): 1 mg via INTRAVENOUS
  Filled 2016-08-20 (×4): qty 2

## 2016-08-20 MED ORDER — ONDANSETRON HCL 4 MG/2ML IJ SOLN: 4.0000 mg | INTRAMUSCULAR | Status: DC | PRN

## 2016-08-20 MED ORDER — CEFAZOLIN SODIUM-DEXTROSE 2-4 GM/100ML-% IV SOLN
2.0000 g | INTRAVENOUS | Status: AC
  Administered 2016-08-20: 2 g via INTRAVENOUS

## 2016-08-20 MED ORDER — PROPOFOL 10 MG/ML IV BOLUS
INTRAVENOUS | Status: AC
  Filled 2016-08-20: qty 20

## 2016-08-20 MED ORDER — LACTATED RINGERS IR SOLN
Status: DC | PRN
  Administered 2016-08-20: 1000 mL

## 2016-08-20 MED ORDER — PROPOFOL 10 MG/ML IV BOLUS
INTRAVENOUS | Status: DC | PRN
  Administered 2016-08-20: 150 mg via INTRAVENOUS

## 2016-08-20 SURGICAL SUPPLY — 56 items
ADH SKN CLS APL DERMABOND .7 (GAUZE/BANDAGES/DRESSINGS) ×2
AGENT HMST KT MTR STRL THRMB (HEMOSTASIS) ×1
APL ESCP 34 STRL LF DISP (HEMOSTASIS) ×1
APPLICATOR SURGIFLO ENDO (HEMOSTASIS) ×2 IMPLANT
BAG SPEC RTRVL LRG 6X4 10 (ENDOMECHANICALS) ×1
CHLORAPREP W/TINT 26ML (MISCELLANEOUS) ×3 IMPLANT
CLIP LIGATING HEM O LOK PURPLE (MISCELLANEOUS) ×3 IMPLANT
CLIP LIGATING HEMO O LOK GREEN (MISCELLANEOUS) ×6 IMPLANT
COVER SURGICAL LIGHT HANDLE (MISCELLANEOUS) ×3 IMPLANT
COVER TIP SHEARS 8 DVNC (MISCELLANEOUS) ×1 IMPLANT
COVER TIP SHEARS 8MM DA VINCI (MISCELLANEOUS) ×2
DECANTER SPIKE VIAL GLASS SM (MISCELLANEOUS) ×3 IMPLANT
DERMABOND ADVANCED (GAUZE/BANDAGES/DRESSINGS) ×4
DERMABOND ADVANCED .7 DNX12 (GAUZE/BANDAGES/DRESSINGS) ×2 IMPLANT
DRAIN CHANNEL 15F RND FF 3/16 (WOUND CARE) ×2 IMPLANT
DRAPE ARM DVNC X/XI (DISPOSABLE) ×4 IMPLANT
DRAPE COLUMN DVNC XI (DISPOSABLE) ×1 IMPLANT
DRAPE DA VINCI XI ARM (DISPOSABLE) ×8
DRAPE DA VINCI XI COLUMN (DISPOSABLE) ×2
DRAPE INCISE IOBAN 66X45 STRL (DRAPES) ×3 IMPLANT
DRAPE SHEET LG 3/4 BI-LAMINATE (DRAPES) ×3 IMPLANT
DRSG TEGADERM 4X4.75 (GAUZE/BANDAGES/DRESSINGS) ×2 IMPLANT
ELECT PENCIL ROCKER SW 15FT (MISCELLANEOUS) ×3 IMPLANT
ELECT REM PT RETURN 15FT ADLT (MISCELLANEOUS) ×3 IMPLANT
EVACUATOR SILICONE 100CC (DRAIN) ×3 IMPLANT
GLOVE BIO SURGEON STRL SZ 6.5 (GLOVE) ×2 IMPLANT
GLOVE BIO SURGEONS STRL SZ 6.5 (GLOVE) ×1
GLOVE BIOGEL M STRL SZ7.5 (GLOVE) ×6 IMPLANT
GOWN STRL REUS W/TWL LRG LVL3 (GOWN DISPOSABLE) ×9 IMPLANT
IRRIG SUCT STRYKERFLOW 2 WTIP (MISCELLANEOUS) ×3
IRRIGATION SUCT STRKRFLW 2 WTP (MISCELLANEOUS) IMPLANT
KIT BASIN OR (CUSTOM PROCEDURE TRAY) ×3 IMPLANT
NS IRRIG 1000ML POUR BTL (IV SOLUTION) ×3 IMPLANT
POSITIONER SURGICAL ARM (MISCELLANEOUS) ×6 IMPLANT
POUCH SPECIMEN RETRIEVAL 10MM (ENDOMECHANICALS) ×3 IMPLANT
SEAL CANN UNIV 5-8 DVNC XI (MISCELLANEOUS) ×4 IMPLANT
SEAL XI 5MM-8MM UNIVERSAL (MISCELLANEOUS) ×8
SOLUTION ELECTROLUBE (MISCELLANEOUS) ×3 IMPLANT
SURGIFLO W/THROMBIN 8M KIT (HEMOSTASIS) ×2 IMPLANT
SUT ETHILON 3 0 PS 1 (SUTURE) ×2 IMPLANT
SUT MNCRL AB 4-0 PS2 18 (SUTURE) ×6 IMPLANT
SUT PDS AB 0 CTX 36 PDP370T (SUTURE) IMPLANT
SUT V-LOC BARB 180 2/0GR6 GS22 (SUTURE) ×3
SUT VIC AB 0 CT1 27 (SUTURE) ×3
SUT VIC AB 0 CT1 27XBRD ANTBC (SUTURE) ×1 IMPLANT
SUT VICRYL 0 UR6 27IN ABS (SUTURE) ×3 IMPLANT
SUT VLOC BARB 180 ABS3/0GR12 (SUTURE) ×3
SUTURE V-LC BRB 180 2/0GR6GS22 (SUTURE) ×1 IMPLANT
SUTURE VLOC BRB 180 ABS3/0GR12 (SUTURE) ×1 IMPLANT
TOWEL OR 17X26 10 PK STRL BLUE (TOWEL DISPOSABLE) ×3 IMPLANT
TOWEL OR NON WOVEN STRL DISP B (DISPOSABLE) ×3 IMPLANT
TRAY FOLEY W/METER SILVER 16FR (SET/KITS/TRAYS/PACK) ×3 IMPLANT
TRAY LAPAROSCOPIC (CUSTOM PROCEDURE TRAY) ×3 IMPLANT
TROCAR UNIVERSAL OPT 12M 100M (ENDOMECHANICALS) ×2 IMPLANT
TROCAR XCEL 12X100 BLDLESS (ENDOMECHANICALS) ×3 IMPLANT
WATER STERILE IRR 1000ML POUR (IV SOLUTION) ×6 IMPLANT

## 2016-08-20 NOTE — Anesthesia Postprocedure Evaluation (Signed)
Anesthesia Post Note  Patient: CAPRI VEALS  Procedure(s) Performed: Procedure(s) (LRB): ROBOTIC ASSISTED LAPAROSCOPIC PARTIAL NEPHRECTOMY (Left)     Patient location during evaluation: PACU Anesthesia Type: General Level of consciousness: awake and alert Pain management: pain level controlled Vital Signs Assessment: post-procedure vital signs reviewed and stable Respiratory status: spontaneous breathing, nonlabored ventilation, respiratory function stable and patient connected to nasal cannula oxygen Cardiovascular status: blood pressure returned to baseline and stable Postop Assessment: no signs of nausea or vomiting Anesthetic complications: no    Last Vitals:  Vitals:   08/20/16 1215 08/20/16 1217  BP: 117/68 117/68  Pulse: 75 74  Resp: (!) 9 11  Temp:  36.4 C    Last Pain:  Vitals:   08/20/16 1217  TempSrc:   PainSc: 4                  Deronda Christian S

## 2016-08-20 NOTE — Anesthesia Procedure Notes (Signed)
Procedure Name: Intubation Date/Time: 08/20/2016 7:25 AM Performed by: Anne Fu Pre-anesthesia Checklist: Patient identified, Emergency Drugs available, Suction available, Patient being monitored and Timeout performed Patient Re-evaluated:Patient Re-evaluated prior to induction Oxygen Delivery Method: Circle system utilized Preoxygenation: Pre-oxygenation with 100% oxygen Induction Type: IV induction Ventilation: Mask ventilation without difficulty Laryngoscope Size: Mac and 4 Grade View: Grade I Tube type: Oral Tube size: 7.5 mm Number of attempts: 1 Airway Equipment and Method: Stylet Placement Confirmation: ETT inserted through vocal cords under direct vision,  positive ETCO2 and breath sounds checked- equal and bilateral Secured at: 21 cm Tube secured with: Tape Dental Injury: Teeth and Oropharynx as per pre-operative assessment

## 2016-08-20 NOTE — Op Note (Signed)
Preoperative diagnosis: Left renal neoplasm  Postoperative diagnosis: Left renal neoplasm  Procedure:  1. Left robotic-assisted laparoscopic partial nephrectomy 2. Intraoperative renal ultrasonography 3.   Surgeon: Roxy Horseman, Brooke Bonito. M.D.  Assistant(s): Debbrah Alar, PA-C  An assistant was required for this surgical procedure.  The duties of the assistant included but were not limited to suctioning, passing suture, camera manipulation, retraction. This procedure would not be able to be performed without an Environmental consultant.  Resident: Dr. Jonna Clark  Anesthesia: General  Complications: None  EBL: 150 mL  IVF:  1200 mL crystalloid  Specimens: 1. Left renal neoplasm  Disposition of specimens: Pathology  Intraoperative findings:       1. Warm renal ischemia time: 14 minutes       2. Intraoperative renal ultrasound findings: It was difficult to adequately visualize the renal mass.  The entire kidney was imaged intraoperatively with ultrasound.  The identified gross lesion did correlate with an area that was partially solid and partially cystic and measured 1.3 cm and appeared distinct from the surrounding parenchyma.  No other lesions were noted on the left kidney.  Drains: 1. # 15 Blake perinephric drain  Indication:  [name] [name] is a 61 y.o. year old patient with a left renal neoplasm.  After a thorough review of the management options for their renal mass, they elected to proceed with surgical treatment and the above procedure.  We have discussed the potential benefits and risks of the procedure, side effects of the proposed treatment, the likelihood of the patient achieving the goals of the procedure, and any potential problems that might occur during the procedure or recuperation. Informed consent has been obtained.   Description of procedure:  The patient was taken to the operating room and a general anesthetic was administered. The patient was given  preoperative antibiotics, placed in the left modified flank position with care to pad all potential pressure points, and prepped and draped in the usual sterile fashion. Next a preoperative timeout was performed.  A site was selected in the upper midline for initial port placement. This was placed using a standard open Hassan technique which allowed entry into the peritoneal cavity under direct vision and without difficulty. A 12 mm port was placed and a pneumoperitoneum established. The camera was then used to inspect the abdomen and there was no evidence of any intra-abdominal injuries or other abnormalities. The remaining abdominal ports were then placed. 8 mm robotic ports were placed in the left upper quadrant, left lower quadrant, and far left lateral abdominal wall. A 8 mm port was placed to the left of the midline just off the rectus muscle for the camera.. All ports were placed under direct vision without difficulty. The surgical cart was then docked.   Utilizing the cautery scissors, the white line of Toldt was incised allowing the colon to be mobilized medially and the plane between the mesocolon and the anterior layer of Gerota's fascia to be developed and the kidney to be exposed.  The ureter and gonadal vein were identified inferiorly and the ureter was lifted anteriorly off the psoas muscle.  Dissection proceeded superiorly along the gonadal vein until the renal vein was identified.  The renal hilum was then carefully isolated with a combination of blunt and sharp dissection allowing the renal arterial and venous structures to be separated and isolated in preparation for renal hilar vessel clamping. There was a single renal artery and renal vein.  12.5 g of IV mannitol was then  administered.   Attention turned to the kidney and the perinephric fat surrounding the renal mass was removed and the kidney was mobilized sufficiently for exposure and resection of the renal mass.   Intraoperative  renal ultrasonography was utilized with the laparoscopic drop in ultrasound probe to identify the renal tumor and identify the tumor margins. There was a poorly visualized lesion that did correlate to the gross findings and imaging findings as described above.   Once the renal mass was properly isolated, preparations were made for resection of the tumor.  Reconstructive sutures were placed into the abdomen for the renorrhaphy portion of the procedure.  The renal artery was then clamped with bulldog clamps.  The tumor was then excised with cold scissor dissection along with an adequate visible gross margin of normal renal parenchyma. The tumor appeared to be excised without any gross violation of the tumor. The renal collecting system was not entered during removal of the tumor.  A running 3-0 V-lock suture was then brought through the capsule of the kidney and run along the base of the renal defect to provide hemostasis and close any entry into the renal collecting system if present. Weck clips were used to secure this suture outside the renal capsule at the proximal and distal ends. An additional hemostatic agent (Surgiflo) was then placed into the renal defect. A running 2-0 V lock suture was then used to close the renal capsule using a sliding clip technique which resulted in excellent compression of the renal defect.    The bulldog clamps were then removed from the renal hilar vessel(s) and an additional 12.5 g of IV mannitol was administered. Total warm renal ischemia time was 14 minutes. The renal tumor resection site was examined. Hemostasis appeared adequate.   The kidney was placed back into its normal anatomic position and covered with perinephric fat as needed.  A # 23 Blake drain was then brought through the lateral lower port site and positioned in the perinephric space.  It was secured to the skin with a nylon suture. The surgical cart was undocked.  The renal tumor specimen was removed intact  within an endopouch retrieval bag via the upper midline port site.  All other laparoscopic/robotic ports had been removed under direct vision and the pneumoperitoneum let down with inspection of the operative field performed and hemostasis again confirmed. The upper midline incision was then closed at the fascial level with 0-vicryl suture. All incision sites were then injected with local anesthetic and reapproximated at the skin level with 4-0 monocryl subcuticular closures.  Liquiband was applied to the skin.  The patient tolerated the procedure well and without complications.  The patient was able to be extubated and transferred to the recovery unit in satisfactory condition.  Pryor Curia MD

## 2016-08-20 NOTE — Progress Notes (Addendum)
Patient ID: [name] [name], female   DOB: 10-Jul-1955, 61 y.o.   MRN: [id number] Post-op note  Subjective: The patient is doing well.  No complaints. Denies N/V  Objective: Vital signs in last 24 hours: Temp:  [97.5 F (36.4 C)-97.6 F (36.4 C)] 97.6 F (36.4 C) (07/23 1233) Pulse Rate:  [65-75] 69 (07/23 1233) Resp:  [9-18] 12 (07/23 1233) BP: (116-134)/(62-87) 116/62 (07/23 1233) SpO2:  [96 %-100 %] 96 % (07/23 1233) Weight:  [71.2 kg (157 lb)] 71.2 kg (157 lb) (07/23 0600)  Intake/Output from previous day: No intake/output data recorded. Intake/Output this shift: Total I/O In: 1600 [I.V.:1600] Out: 590 [Urine:400; Drains:40; Blood:150]  Physical Exam:  General: Alert and oriented. Abdomen: Soft, Nondistended. Incisions: Clean and dry. Urine: clear  Lab Results:  Recent Labs  08/20/16 1152  HGB 12.7  HCT 38.0    Assessment/Plan: POD#0   1) Continue to monitor  2) DVT prophy, clears, IS, bed rest,  pain control   LOS: 0 days   DANCY, AMANDA 08/20/2016, 1:19 PM    I have seen and examined the patient and agree with the above assessment and plan.  Will begin ambulation tonight considering small size of tumor and low risk of renal bleeding.  Pt confirms that she has not had a known adverse reaction to tramadol.

## 2016-08-20 NOTE — Anesthesia Preprocedure Evaluation (Signed)
Anesthesia Evaluation  Patient identified by MRN, date of birth, ID band Patient awake    Reviewed: Allergy & Precautions, NPO status , Patient's Chart, lab work & pertinent test results  Airway Mallampati: II  TM Distance: >3 FB Neck ROM: Full    Dental no notable dental hx.    Pulmonary neg pulmonary ROS,    Pulmonary exam normal breath sounds clear to auscultation       Cardiovascular negative cardio ROS Normal cardiovascular exam Rhythm:Regular Rate:Normal     Neuro/Psych negative neurological ROS  negative psych ROS   GI/Hepatic Neg liver ROS, GERD  Medicated,  Endo/Other  negative endocrine ROS  Renal/GU negative Renal ROS  negative genitourinary   Musculoskeletal negative musculoskeletal ROS (+)   Abdominal   Peds negative pediatric ROS (+)  Hematology negative hematology ROS (+)   Anesthesia Other Findings   Reproductive/Obstetrics negative OB ROS                             Anesthesia Physical Anesthesia Plan  ASA: II  Anesthesia Plan: General   Post-op Pain Management:    Induction: Intravenous  PONV Risk Score and Plan: 1 and Ondansetron and Dexamethasone  Airway Management Planned: Oral ETT  Additional Equipment:   Intra-op Plan:   Post-operative Plan: Extubation in OR  Informed Consent: I have reviewed the patients History and Physical, chart, labs and discussed the procedure including the risks, benefits and alternatives for the proposed anesthesia with the patient or authorized representative who has indicated his/her understanding and acceptance.   Dental advisory given  Plan Discussed with: CRNA and Surgeon  Anesthesia Plan Comments:         Anesthesia Quick Evaluation

## 2016-08-20 NOTE — Transfer of Care (Signed)
Immediate Anesthesia Transfer of Care Note  Patient: [name] [name]  Procedure(s) Performed: Procedure(s): ROBOTIC ASSISTED LAPAROSCOPIC PARTIAL NEPHRECTOMY (Left)  Patient Location: PACU  Anesthesia Type:General  Level of Consciousness:  sedated, patient cooperative and responds to stimulation  Airway & Oxygen Therapy:Patient Spontanous Breathing and Patient connected to face mask oxgen  Post-op Assessment:  Report given to PACU RN and Post -op Vital signs reviewed and stable  Post vital signs:  Reviewed and stable  Last Vitals:  Vitals:   08/20/16 0532  BP: 134/73  Pulse: 65  Resp: 18  Temp: 73.4 C    Complications: No apparent anesthesia complications

## 2016-08-20 NOTE — Discharge Instructions (Signed)

## 2016-08-21 ENCOUNTER — Encounter (HOSPITAL_COMMUNITY): Payer: Self-pay | Admitting: Urology

## 2016-08-21 LAB — BASIC METABOLIC PANEL
Anion gap: 9 (ref 5–15)
BUN: 11 mg/dL (ref 6–20)
CHLORIDE: 104 mmol/L (ref 101–111)
CO2: 23 mmol/L (ref 22–32)
CREATININE: 1.06 mg/dL — AB (ref 0.44–1.00)
Calcium: 8.3 mg/dL — ABNORMAL LOW (ref 8.9–10.3)
GFR, EST NON AFRICAN AMERICAN: 56 mL/min — AB (ref >60)
Glucose, Bld: 211 mg/dL — ABNORMAL HIGH (ref 65–99)
POTASSIUM: 3.8 mmol/L (ref 3.5–5.1)
SODIUM: 136 mmol/L (ref 135–145)

## 2016-08-21 LAB — CREATININE, FLUID (PLEURAL, PERITONEAL, JP DRAINAGE): Creat, Fluid: 1 mg/dL

## 2016-08-21 LAB — HEMOGLOBIN AND HEMATOCRIT, BLOOD
HCT: 35.9 % — ABNORMAL LOW (ref 36.0–46.0)
HEMOGLOBIN: 11.7 g/dL — AB (ref 12.0–15.0)

## 2016-08-21 MED ORDER — DOCUSATE SODIUM 100 MG PO CAPS: 100.0000 mg | ORAL_CAPSULE | Freq: Two times a day (BID) | ORAL | 3 refills | Status: AC

## 2016-08-21 MED ORDER — OXYCODONE HCL 5 MG PO TABS: 5.0000 mg | ORAL_TABLET | ORAL | 0 refills | Status: AC | PRN

## 2016-08-21 MED ORDER — OXYCODONE HCL 5 MG PO TABS
5.0000 mg | ORAL_TABLET | ORAL | Status: DC | PRN
  Administered 2016-08-21: 5 mg via ORAL
  Filled 2016-08-21: qty 1

## 2016-08-21 MED ORDER — BISACODYL 10 MG RE SUPP
10.0000 mg | Freq: Once | RECTAL | Status: AC
Start: 2016-08-21 — End: 2016-08-21
  Administered 2016-08-21: 10 mg via RECTAL
  Filled 2016-08-21: qty 1

## 2016-08-21 NOTE — Progress Notes (Signed)
Patient ID: [name] [name], female   DOB: 1955/09/19, 61 y.o.   MRN: [id number]   1 Day Post-Op Subjective: Doing well.   Tolerating clears.  No nausea.  No flatus.  Ambulated once last night.  Objective: Vital signs in last 24 hours: Temp:  [97.5 F (36.4 C)-98.2 F (36.8 C)] 97.9 F (36.6 C) (07/24 0623) Pulse Rate:  [60-75] 70 (07/24 0623) Resp:  [9-17] 12 (07/24 0623) BP: (103-127)/(49-87) 103/49 (07/24 0623) SpO2:  [96 %-100 %] 99 % (07/24 0623)  Intake/Output from previous day: 07/23 0701 - 07/24 0700 In: 4387.5 [P.O.:180; I.V.:3957.5; IV Piggyback:250] Out: 2440 [Urine:2175; Drains:115; Blood:150] Intake/Output this shift: No intake/output data recorded.  Physical Exam:  General: Alert and oriented CV: RRR Lungs: Clear Abdomen: Soft, ND, minimal BS Incisions: C/D/I Ext: NT, No erythema  Lab Results:  Recent Labs  08/20/16 1152 08/21/16 0352  HGB 12.7 11.7*  HCT 38.0 35.9*   BMET  Recent Labs  08/20/16 1152 08/21/16 0352  NA 139 136  K 3.7 3.8  CL 106 104  CO2 29 23  GLUCOSE 152* 211*  BUN 10 11  CREATININE 0.95 1.06*  CALCIUM 8.6* 8.3*     Studies/Results: No results found.  Assessment/Plan: POD # 1 s/p right RAL partial nephrectomy - Ambulate, IS - Advance diet - D/C catheter - Check drain Cr - SL IVF - Oral pain medication - Possible d/c late this afternoon if progresses well throughout today, if not tomorrow AM - Path pending   LOS: 1 day   [name] [name],LES 08/21/2016, 7:20 AM

## 2016-08-21 NOTE — Progress Notes (Signed)
Patient is stable for discharge. Discharge instructions and medications have been reviewed with the patient and all questions answered.   

## 2016-08-21 NOTE — Progress Notes (Signed)
Patient ID: [name] [name], female   DOB: [birth year], 61 y.o.   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  Prognosis is excellent.

## 2016-08-21 NOTE — Discharge Summary (Signed)
[facility] Discharge Summary  Admit date: 08/20/2016  Discharge date and time: 08/21/16   Discharge to: Home  Discharge Service: Urology Discharge Attending Physician:  Dr. Dutch Gray  Discharge  Diagnoses: Left renal mass   Secondary Diagnosis: Active Problems:   Neoplasm of left kidney   OR Procedures: Procedure(s): ROBOTIC ASSISTED LAPAROSCOPIC PARTIAL NEPHRECTOMY 08/20/2016   Ancillary Procedures: None   Discharge Day Services: The patient was seen and examined by the Urology team both in the morning and immediately prior to discharge.  Vital signs and laboratory values were stable and within normal limits.  The physical exam was benign and unchanged and all surgical wounds were examined.  Discharge instructions were explained and all questions answered.  Subjective  No acute events overnight. Pain Controlled. No fever or chills.  Objective Patient Vitals for the past 8 hrs:  BP Temp Temp src Pulse Resp SpO2  08/21/16 1458 139/65 97.8 F (36.6 C) Oral 77 16 95 %   Total I/O In: 462.5 [I.V.:462.5] Out: 1543 [Urine:1525; Drains:18]  General Appearance:        No acute distress Lungs:                 Normal work of breathing on room air Heart:                             Regular rate and rhythm Abdomen:                       Soft, non-tender, non-distended. Incisions clean dry and intact with dermabond. JP site dressed.  Extremities:                  Warm and well perfused    Hospital Course:  The patient underwent left robotic assisted laparoscopic partial nephrectomy on 08/20/2016.  The patient tolerated the procedure well, was extubated in the OR, and afterwards was taken to the PACU for routine post-surgical care. When stable the patient was transferred to the floor.   The patient did well postoperatively.  The patient's diet was slowly advanced and at the time of discharge was tolerating a regular diet.  Foley catheter was removed on POD1 and patient was able  to void spontaneously. JP fluid was checked for urine leak prior to removal, and was negative. JP drain was removed without complication on POD1. The patient was discharged home 1 Day Post-Op, at which point was tolerating a regular solid diet (had not yet passed flatus), was able to void spontaneously, have adequate pain control with P.O. pain medication, and could ambulate without difficulty. The patient will follow up with [place] for post op check.   Condition at Discharge: Improved  Discharge Medications:  Allergies as of 08/21/2016      Reactions   Aspirin Hives, Itching   Codeine Hives, Itching   Hydrocodone-acetaminophen Hives, Itching      Medication List    STOP taking these medications   CALCIUM 600 + D PO   co-enzyme Q-10 30 MG capsule   multivitamin with minerals Tabs tablet   Vitamin D3 2000 units capsule     TAKE these medications   docusate sodium 100 MG capsule Commonly known as:  COLACE Take 1 capsule (100 mg total) by mouth 2 (two) times daily.   Fluticasone-Salmeterol 250-50 MCG/DOSE Aepb Commonly known as:  ADVAIR Inhale 1 puff into the lungs 2 (two) times daily.   levalbuterol  1.25 MG/3ML nebulizer solution Commonly known as:  XOPENEX Inhale 3 mLs into the lungs every 4 (four) hours as needed for shortness of breath or wheezing.   levalbuterol 45 MCG/ACT inhaler Commonly known as:  XOPENEX HFA Inhale 2 puffs into the lungs every 4 (four) hours as needed for shortness of breath or wheezing.   MINIVELLE 0.1 MG/24HR patch Generic drug:  estradiol Place 0.1 mg onto the skin 2 (two) times a week.   NASAL SPRAY NA Place 1 spray into both nostrils every 4 (four) hours as needed (congestion).   oxyCODONE 5 MG immediate release tablet Commonly known as:  Oxy IR/ROXICODONE Take 1 tablet (5 mg total) by mouth every 4 (four) hours as needed for moderate pain or severe pain.   ranitidine 75 MG tablet Commonly known as:  ZANTAC Take 75 mg by mouth daily.    simvastatin 10 MG tablet Commonly known as:  ZOCOR Take 10 mg by mouth every evening.        Pending Test Results: surgical pathology   Discharge Instructions: . Discharge Instructions    Call MD for:  persistant nausea and vomiting    Complete by:  As directed    Call MD for:  redness, tenderness, or signs of infection (pain, swelling, redness, odor or green/yellow discharge around incision site)    Complete by:  As directed    Call MD for:  severe uncontrolled pain    Complete by:  As directed    Call MD for:  temperature >100.4    Complete by:  As directed    Diet general    Complete by:  As directed    Discharge instructions    Complete by:  As directed    Activity:  You are encouraged to ambulate frequently (about every hour during waking hours) to help prevent blood clots from forming in your legs or lungs.  However, you should not engage in any heavy lifting (> 10-15 lbs), strenuous activity, or straining. Diet: You should advance your diet as instructed by your physician.  It will be normal to have some bloating, nausea, and abdominal discomfort intermittently. Prescriptions:  You will be provided a prescription for pain medication to take as needed.  If your pain is not severe enough to require the prescription pain medication, you may take extra strength Tylenol instead which will have less side effects.  You should also take a prescribed stool softener to avoid straining with bowel movements as the prescription pain medication may constipate you. Incisions: You may remove your dressing bandages 48 hours after surgery if not removed in the hospital.  You will either have some small staples or special tissue glue at each of the incision sites. Once the bandages are removed (if present), the incisions may stay open to air.  You may start showering (but not soaking or bathing in water) the 2nd day after surgery and the incisions simply need to be patted dry after the shower.  No  additional care is needed. What to call us about: You should call the office [phone]) if you develop fever > 101 or develop persistent vomiting.   Driving Restrictions    Complete by:  As directed    No driving while taking narcotic medication   Increase activity slowly    Complete by:  As directed    Remove dressing in 48 hours    Complete by:  As directed    Remove drain dressing in 48 hours. Change as needed  when soiled. Hole will close on its own in the next 3-4 days

## 2017-05-01 ENCOUNTER — Ambulatory Visit (HOSPITAL_COMMUNITY)
Admission: RE | Admit: 2017-05-01 | Discharge: 2017-05-01 | Disposition: A | Discharge status: Home / Self Care | Payer: BC Managed Care – PPO | Source: Ambulatory Visit | Attending: Urology | Admitting: Urology

## 2017-05-01 ENCOUNTER — Other Ambulatory Visit (HOSPITAL_COMMUNITY): Payer: Self-pay | Admitting: Urology

## 2017-05-01 DIAGNOSIS — C642 Malignant neoplasm of left kidney, except renal pelvis: Secondary | ICD-10-CM

## 2017-12-04 ENCOUNTER — Ambulatory Visit (HOSPITAL_COMMUNITY)
Admission: RE | Admit: 2017-12-04 | Discharge: 2017-12-04 | Disposition: A | Discharge status: Home / Self Care | Payer: BC Managed Care – PPO | Source: Ambulatory Visit | Attending: Urology | Admitting: Urology

## 2017-12-04 ENCOUNTER — Other Ambulatory Visit: Payer: Self-pay | Admitting: Urology

## 2017-12-04 DIAGNOSIS — C642 Malignant neoplasm of left kidney, except renal pelvis: Secondary | ICD-10-CM

## 2018-06-20 ENCOUNTER — Other Ambulatory Visit: Payer: Self-pay

## 2018-06-20 ENCOUNTER — Ambulatory Visit (HOSPITAL_COMMUNITY)
Admission: RE | Admit: 2018-06-20 | Discharge: 2018-06-20 | Disposition: A | Discharge status: Home / Self Care | Payer: BC Managed Care – PPO | Source: Ambulatory Visit | Attending: Urology | Admitting: Urology

## 2018-06-20 ENCOUNTER — Other Ambulatory Visit (HOSPITAL_COMMUNITY): Payer: Self-pay | Admitting: Urology

## 2018-06-20 DIAGNOSIS — C642 Malignant neoplasm of left kidney, except renal pelvis: Secondary | ICD-10-CM | POA: Diagnosis not present

## 2020-11-04 IMAGING — CR CHEST - 2 VIEW
2 series · 2 of 2 positions shown · non-contrast
Comparison: 12/04/2017

CLINICAL DATA: History of renal cell carcinoma

EXAM:
CHEST - 2 VIEW

[w chest pa]
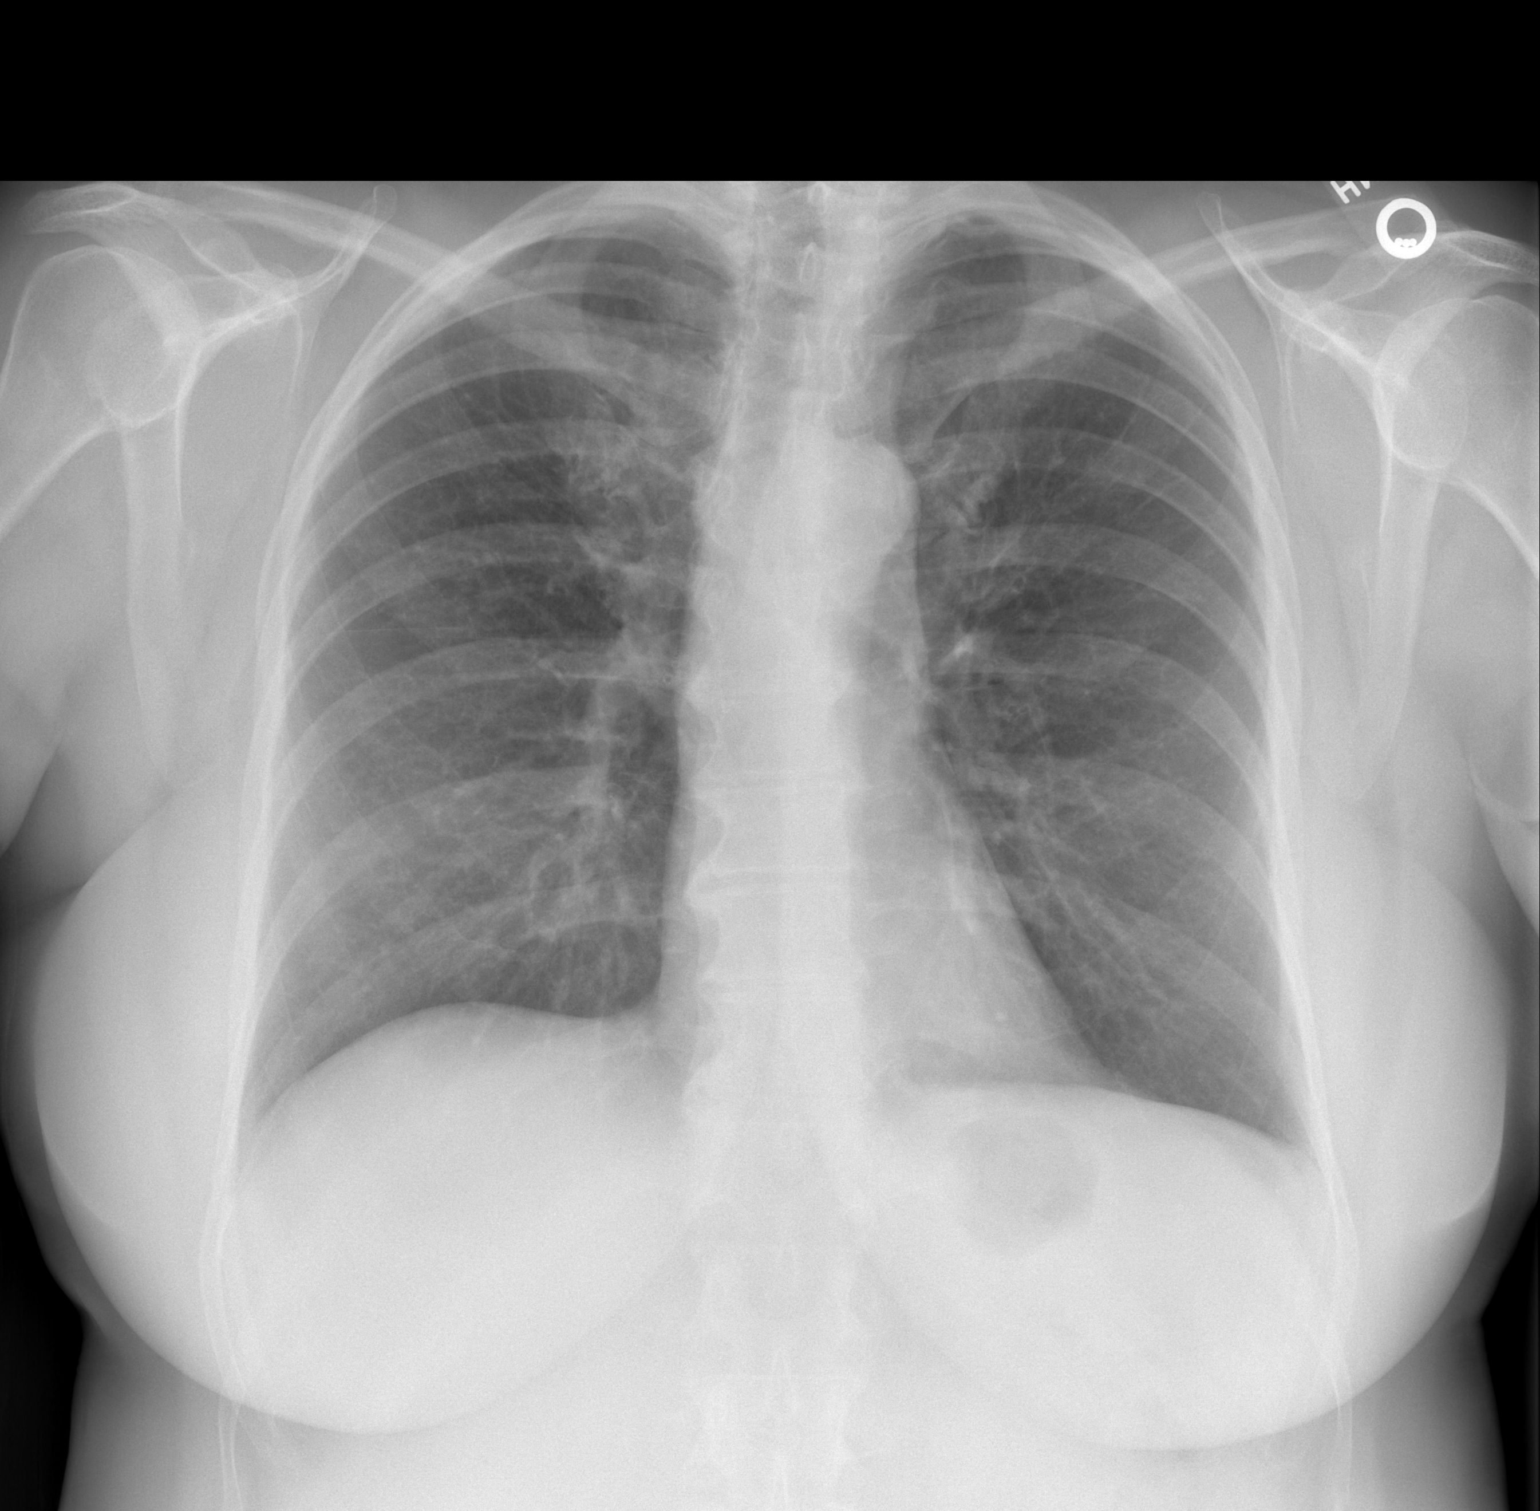

[w chest lat]
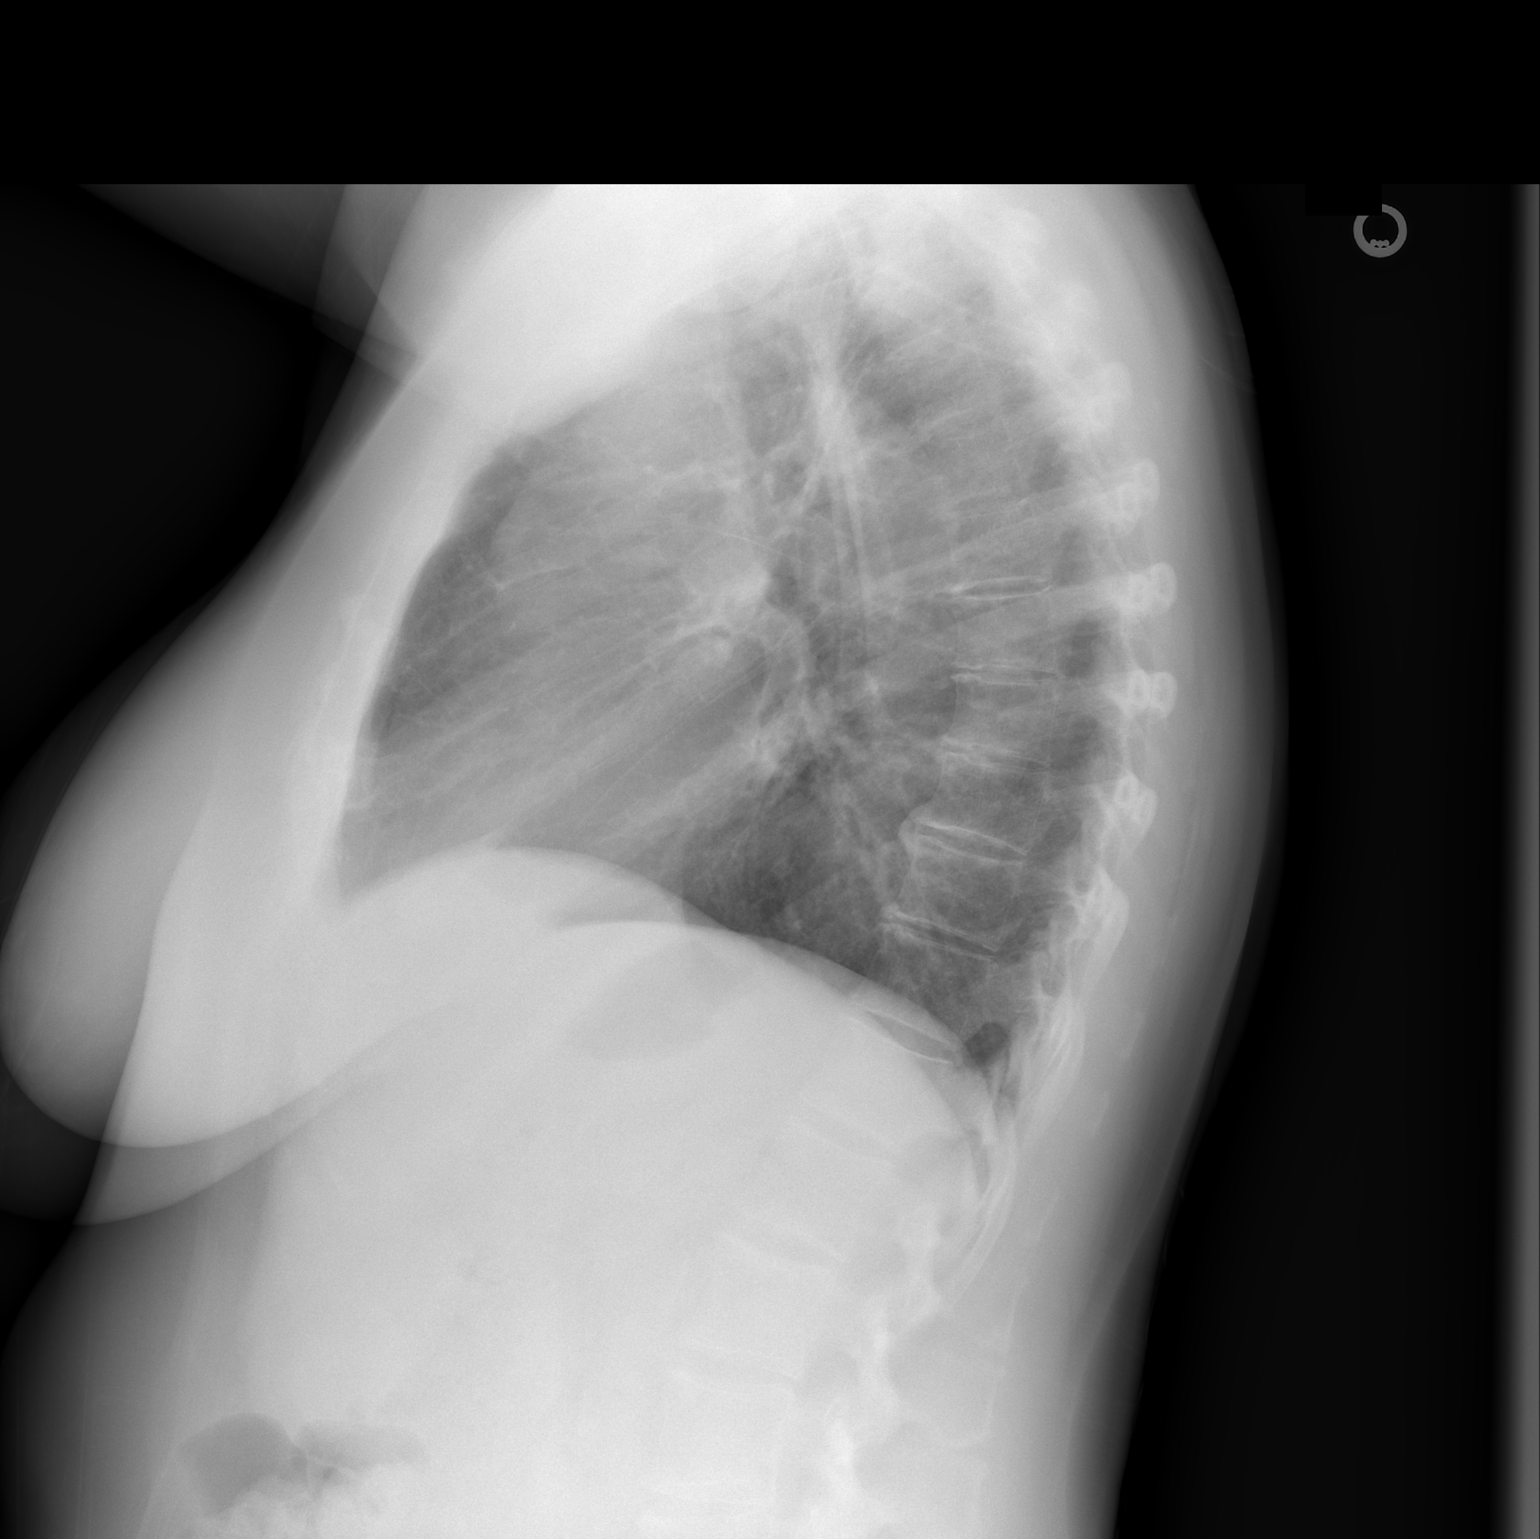

[2 of 2 positions shown; findings below may reference images not displayed]

FINDINGS: The heart size and mediastinal contours are within normal limits.
Both lungs are clear. The visualized skeletal structures are
unremarkable.
IMPRESSION: No acute abnormality of the lungs. No radiographic evidence of
pulmonary metastatic disease. Please note that CT is more sensitive
for the detection of pulmonary metastatic disease.

## 2020-12-07 ENCOUNTER — Other Ambulatory Visit (HOSPITAL_COMMUNITY): Payer: Self-pay | Admitting: Urology

## 2020-12-07 ENCOUNTER — Ambulatory Visit (HOSPITAL_COMMUNITY)
Admission: RE | Admit: 2020-12-07 | Discharge: 2020-12-07 | Disposition: A | Discharge status: Home / Self Care | Payer: BC Managed Care – PPO | Source: Ambulatory Visit | Attending: Urology | Admitting: Urology

## 2020-12-07 DIAGNOSIS — Z85528 Personal history of other malignant neoplasm of kidney: Secondary | ICD-10-CM

## 2021-12-06 ENCOUNTER — Ambulatory Visit (HOSPITAL_COMMUNITY)
Admission: RE | Admit: 2021-12-06 | Discharge: 2021-12-06 | Disposition: A | Discharge status: Home / Self Care | Payer: BC Managed Care – PPO | Source: Ambulatory Visit | Attending: Urology | Admitting: Urology

## 2021-12-06 ENCOUNTER — Other Ambulatory Visit (HOSPITAL_COMMUNITY): Payer: Self-pay | Admitting: Urology

## 2021-12-06 DIAGNOSIS — Z85528 Personal history of other malignant neoplasm of kidney: Secondary | ICD-10-CM | POA: Insufficient documentation

## 2022-09-10 DIAGNOSIS — E119 Type 2 diabetes mellitus without complications: Secondary | ICD-10-CM | POA: Diagnosis not present

## 2022-09-10 DIAGNOSIS — Z85528 Personal history of other malignant neoplasm of kidney: Secondary | ICD-10-CM | POA: Diagnosis not present

## 2022-09-10 DIAGNOSIS — R748 Abnormal levels of other serum enzymes: Secondary | ICD-10-CM | POA: Diagnosis not present

## 2022-09-10 DIAGNOSIS — J4520 Mild intermittent asthma, uncomplicated: Secondary | ICD-10-CM | POA: Diagnosis not present

## 2022-09-10 DIAGNOSIS — E559 Vitamin D deficiency, unspecified: Secondary | ICD-10-CM | POA: Diagnosis not present

## 2022-09-10 DIAGNOSIS — J309 Allergic rhinitis, unspecified: Secondary | ICD-10-CM | POA: Diagnosis not present

## 2022-09-10 DIAGNOSIS — E7800 Pure hypercholesterolemia, unspecified: Secondary | ICD-10-CM | POA: Diagnosis not present

## 2022-09-10 DIAGNOSIS — Z85820 Personal history of malignant melanoma of skin: Secondary | ICD-10-CM | POA: Diagnosis not present

## 2023-03-20 DIAGNOSIS — K760 Fatty (change of) liver, not elsewhere classified: Secondary | ICD-10-CM | POA: Diagnosis not present

## 2023-03-20 DIAGNOSIS — E7800 Pure hypercholesterolemia, unspecified: Secondary | ICD-10-CM | POA: Diagnosis not present

## 2023-03-20 DIAGNOSIS — Z85820 Personal history of malignant melanoma of skin: Secondary | ICD-10-CM | POA: Diagnosis not present

## 2023-03-20 DIAGNOSIS — K219 Gastro-esophageal reflux disease without esophagitis: Secondary | ICD-10-CM | POA: Diagnosis not present

## 2023-03-20 DIAGNOSIS — J4520 Mild intermittent asthma, uncomplicated: Secondary | ICD-10-CM | POA: Diagnosis not present

## 2023-03-20 DIAGNOSIS — Z85528 Personal history of other malignant neoplasm of kidney: Secondary | ICD-10-CM | POA: Diagnosis not present

## 2023-03-20 DIAGNOSIS — E119 Type 2 diabetes mellitus without complications: Secondary | ICD-10-CM | POA: Diagnosis not present

## 2023-04-03 DIAGNOSIS — H02834 Dermatochalasis of left upper eyelid: Secondary | ICD-10-CM | POA: Diagnosis not present

## 2023-04-03 DIAGNOSIS — H5055 Alternating heterophoria: Secondary | ICD-10-CM | POA: Diagnosis not present

## 2023-04-03 DIAGNOSIS — H25813 Combined forms of age-related cataract, bilateral: Secondary | ICD-10-CM | POA: Diagnosis not present

## 2023-04-03 DIAGNOSIS — E119 Type 2 diabetes mellitus without complications: Secondary | ICD-10-CM | POA: Diagnosis not present

## 2023-04-03 DIAGNOSIS — H02831 Dermatochalasis of right upper eyelid: Secondary | ICD-10-CM | POA: Diagnosis not present

## 2023-04-03 DIAGNOSIS — H40013 Open angle with borderline findings, low risk, bilateral: Secondary | ICD-10-CM | POA: Diagnosis not present

## 2023-04-09 DIAGNOSIS — E1136 Type 2 diabetes mellitus with diabetic cataract: Secondary | ICD-10-CM | POA: Diagnosis not present

## 2023-04-09 DIAGNOSIS — J45909 Unspecified asthma, uncomplicated: Secondary | ICD-10-CM | POA: Diagnosis not present

## 2023-04-09 DIAGNOSIS — H25811 Combined forms of age-related cataract, right eye: Secondary | ICD-10-CM | POA: Diagnosis not present

## 2023-04-09 DIAGNOSIS — F419 Anxiety disorder, unspecified: Secondary | ICD-10-CM | POA: Diagnosis not present

## 2023-04-09 DIAGNOSIS — Z79899 Other long term (current) drug therapy: Secondary | ICD-10-CM | POA: Diagnosis not present

## 2023-04-09 DIAGNOSIS — K219 Gastro-esophageal reflux disease without esophagitis: Secondary | ICD-10-CM | POA: Diagnosis not present

## 2023-04-10 DIAGNOSIS — E119 Type 2 diabetes mellitus without complications: Secondary | ICD-10-CM | POA: Diagnosis not present

## 2023-04-10 DIAGNOSIS — E7800 Pure hypercholesterolemia, unspecified: Secondary | ICD-10-CM | POA: Diagnosis not present

## 2023-04-16 DIAGNOSIS — H25812 Combined forms of age-related cataract, left eye: Secondary | ICD-10-CM | POA: Diagnosis not present

## 2023-04-16 DIAGNOSIS — E119 Type 2 diabetes mellitus without complications: Secondary | ICD-10-CM | POA: Diagnosis not present

## 2023-04-16 DIAGNOSIS — J45909 Unspecified asthma, uncomplicated: Secondary | ICD-10-CM | POA: Diagnosis not present

## 2023-04-16 DIAGNOSIS — E7800 Pure hypercholesterolemia, unspecified: Secondary | ICD-10-CM | POA: Diagnosis not present

## 2023-04-24 IMAGING — DX DG CHEST 2V
2 series · 2 of 2 positions shown · non-contrast
Comparison: CT chest-abdomen 12/16/2019; X-ray chest 06/20/2018.

CLINICAL DATA: Renal cancer.

EXAM:
CHEST - 2 VIEW

[chest pa]
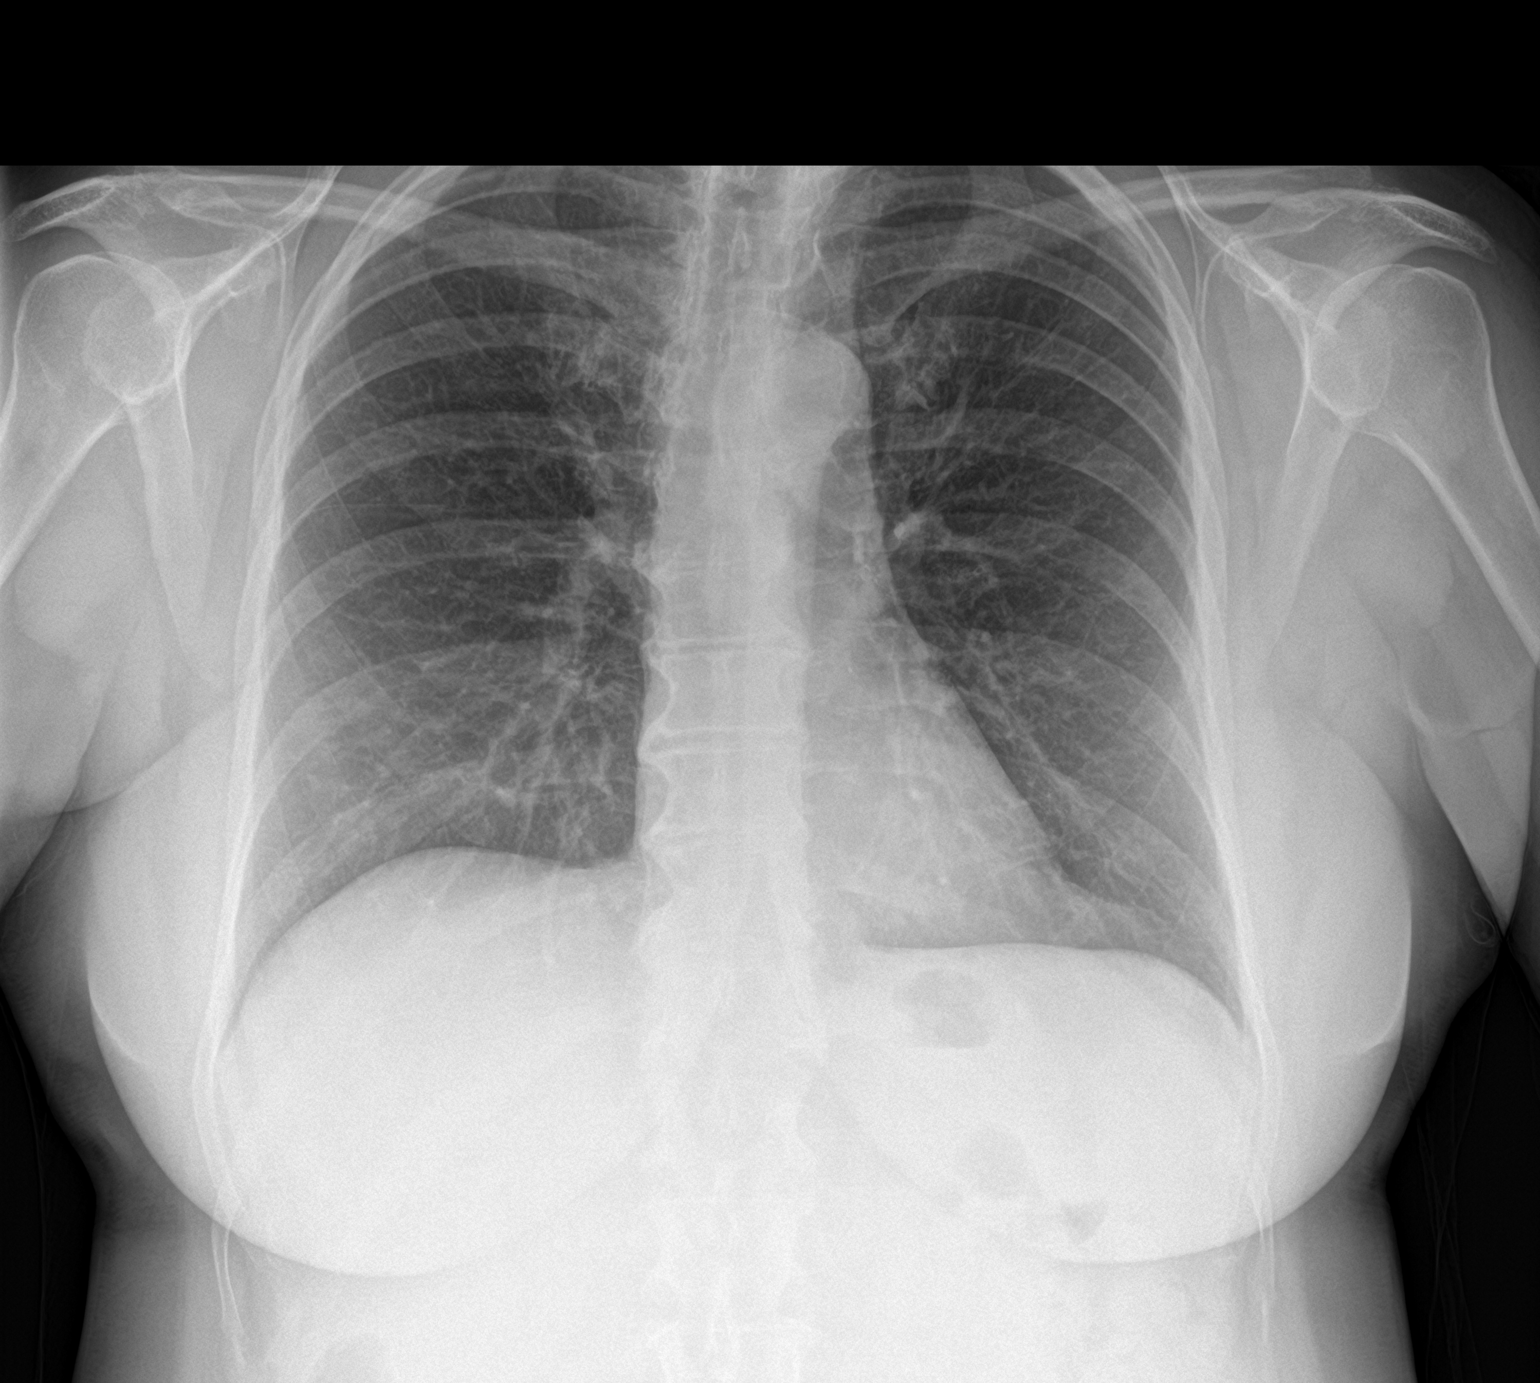

[chest lat]
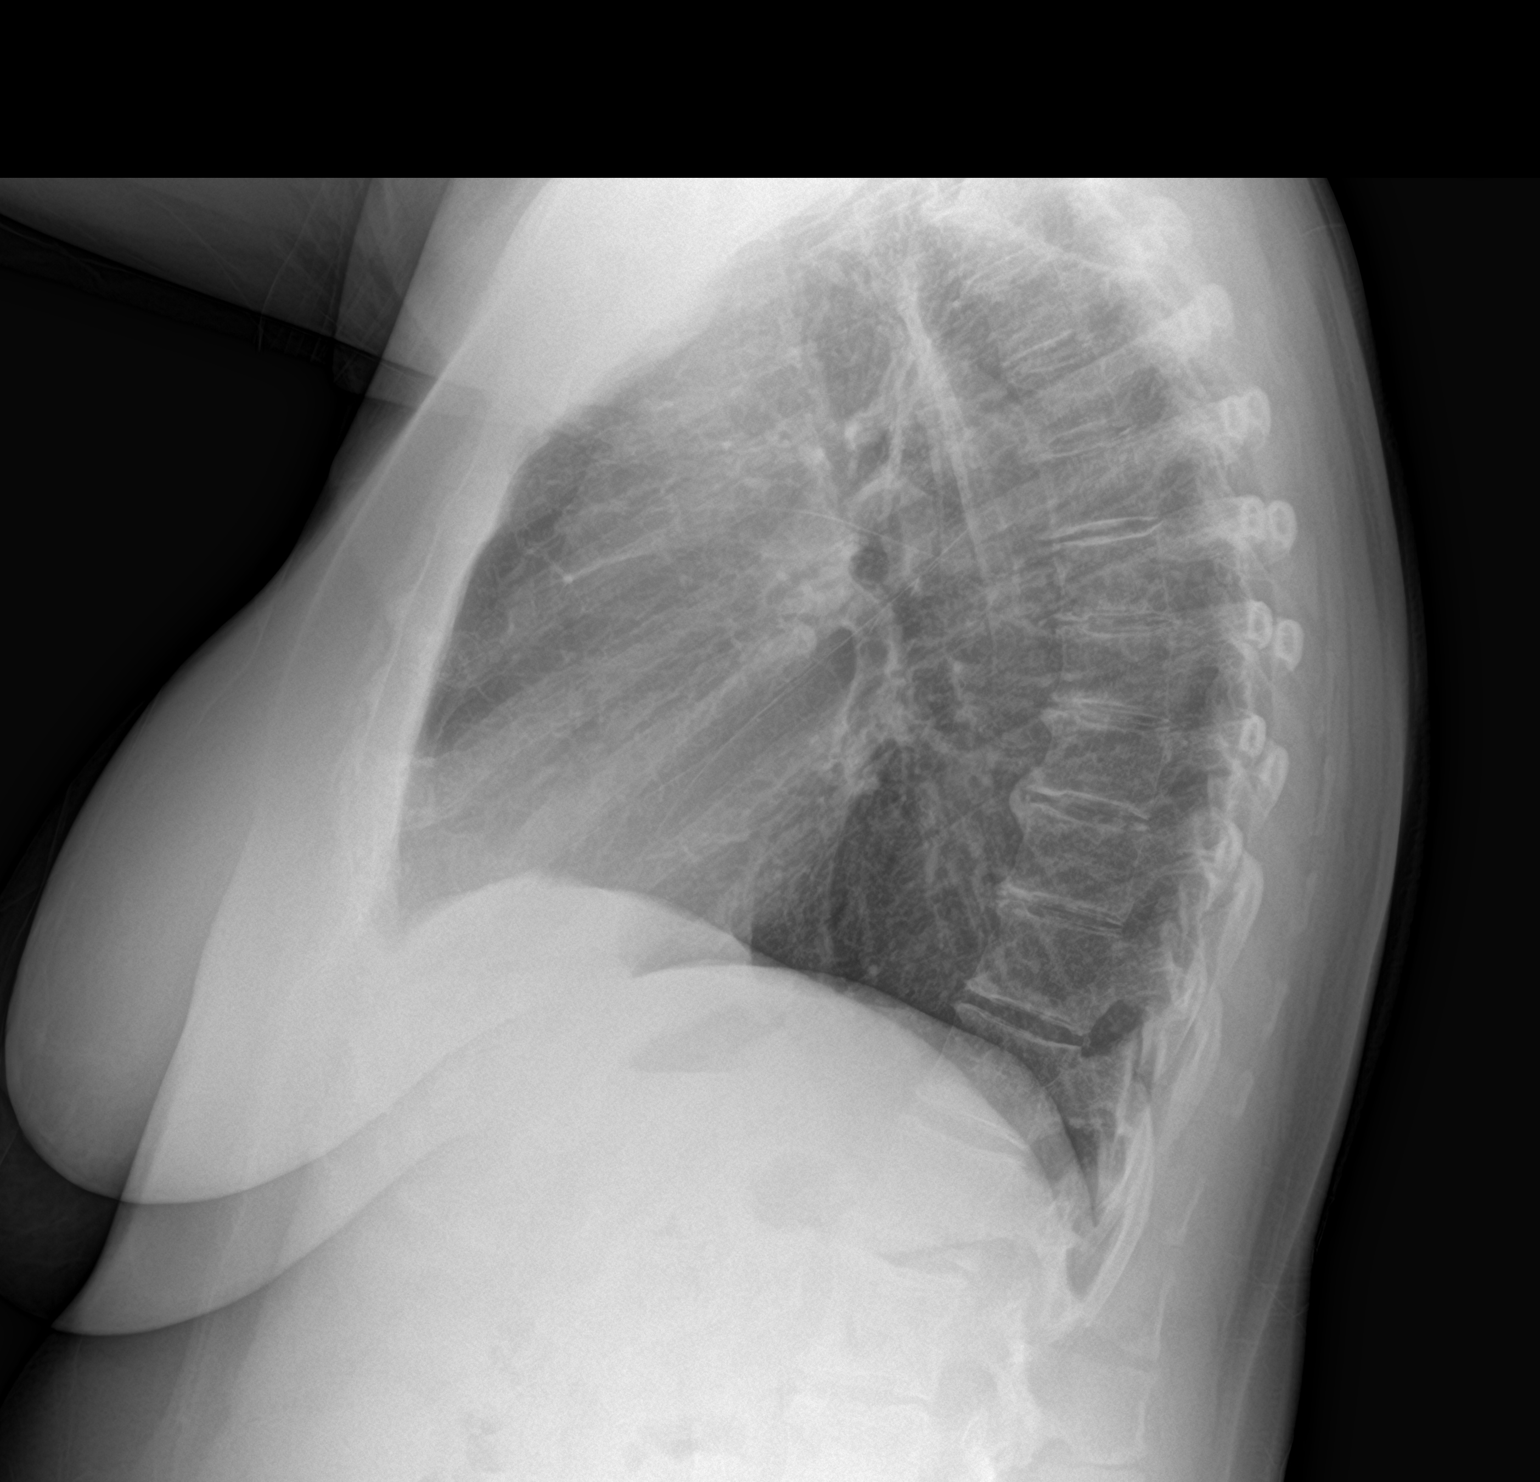

[2 of 2 positions shown; findings below may reference images not displayed]

FINDINGS: The heart size and mediastinal contours are within normal limits. No
focal airspace consolidation, pleural effusion, or pneumothorax. The
visualized skeletal structures are unremarkable.
IMPRESSION: No active cardiopulmonary disease.

## 2023-05-30 DIAGNOSIS — Z6824 Body mass index (BMI) 24.0-24.9, adult: Secondary | ICD-10-CM | POA: Diagnosis not present

## 2023-05-30 DIAGNOSIS — Z01419 Encounter for gynecological examination (general) (routine) without abnormal findings: Secondary | ICD-10-CM | POA: Diagnosis not present

## 2023-05-30 DIAGNOSIS — Z1231 Encounter for screening mammogram for malignant neoplasm of breast: Secondary | ICD-10-CM | POA: Diagnosis not present

## 2023-07-17 DIAGNOSIS — D225 Melanocytic nevi of trunk: Secondary | ICD-10-CM | POA: Diagnosis not present

## 2023-07-17 DIAGNOSIS — Z85820 Personal history of malignant melanoma of skin: Secondary | ICD-10-CM | POA: Diagnosis not present

## 2023-08-29 DIAGNOSIS — M8588 Other specified disorders of bone density and structure, other site: Secondary | ICD-10-CM | POA: Diagnosis not present

## 2023-08-29 DIAGNOSIS — N958 Other specified menopausal and perimenopausal disorders: Secondary | ICD-10-CM | POA: Diagnosis not present

## 2023-09-10 DIAGNOSIS — E559 Vitamin D deficiency, unspecified: Secondary | ICD-10-CM | POA: Diagnosis not present

## 2023-09-10 DIAGNOSIS — E7800 Pure hypercholesterolemia, unspecified: Secondary | ICD-10-CM | POA: Diagnosis not present

## 2023-09-10 DIAGNOSIS — E119 Type 2 diabetes mellitus without complications: Secondary | ICD-10-CM | POA: Diagnosis not present

## 2023-09-10 DIAGNOSIS — R748 Abnormal levels of other serum enzymes: Secondary | ICD-10-CM | POA: Diagnosis not present

## 2023-09-10 DIAGNOSIS — K219 Gastro-esophageal reflux disease without esophagitis: Secondary | ICD-10-CM | POA: Diagnosis not present

## 2023-09-10 DIAGNOSIS — J309 Allergic rhinitis, unspecified: Secondary | ICD-10-CM | POA: Diagnosis not present

## 2023-09-10 DIAGNOSIS — N760 Acute vaginitis: Secondary | ICD-10-CM | POA: Diagnosis not present

## 2023-09-10 DIAGNOSIS — Z85820 Personal history of malignant melanoma of skin: Secondary | ICD-10-CM | POA: Diagnosis not present

## 2023-09-10 DIAGNOSIS — J4520 Mild intermittent asthma, uncomplicated: Secondary | ICD-10-CM | POA: Diagnosis not present

## 2023-11-18 DIAGNOSIS — Z0000 Encounter for general adult medical examination without abnormal findings: Secondary | ICD-10-CM | POA: Diagnosis not present

## 2023-11-18 DIAGNOSIS — J4520 Mild intermittent asthma, uncomplicated: Secondary | ICD-10-CM | POA: Diagnosis not present

## 2023-11-18 DIAGNOSIS — J309 Allergic rhinitis, unspecified: Secondary | ICD-10-CM | POA: Diagnosis not present

## 2023-11-18 DIAGNOSIS — E119 Type 2 diabetes mellitus without complications: Secondary | ICD-10-CM | POA: Diagnosis not present

## 2023-11-18 DIAGNOSIS — K219 Gastro-esophageal reflux disease without esophagitis: Secondary | ICD-10-CM | POA: Diagnosis not present
